# Patient Record
Sex: Female | Born: 1955
Health system: Southern US, Community
[De-identification: ages and names within clinical notes are randomized; demographics above are authoritative.]

## PROBLEM LIST (undated history)

## (undated) DIAGNOSIS — I150 Renovascular hypertension: Secondary | ICD-10-CM

## (undated) DIAGNOSIS — I499 Cardiac arrhythmia, unspecified: Secondary | ICD-10-CM

## (undated) DIAGNOSIS — C50919 Malignant neoplasm of unspecified site of unspecified female breast: Secondary | ICD-10-CM

## (undated) DIAGNOSIS — R001 Bradycardia, unspecified: Secondary | ICD-10-CM

## (undated) DIAGNOSIS — I773 Arterial fibromuscular dysplasia: Secondary | ICD-10-CM

## (undated) DIAGNOSIS — L509 Urticaria, unspecified: Secondary | ICD-10-CM

## (undated) DIAGNOSIS — E2839 Other primary ovarian failure: Secondary | ICD-10-CM

## (undated) DIAGNOSIS — Z9889 Other specified postprocedural states: Secondary | ICD-10-CM

## (undated) DIAGNOSIS — R112 Nausea with vomiting, unspecified: Secondary | ICD-10-CM

## (undated) DIAGNOSIS — H04123 Dry eye syndrome of bilateral lacrimal glands: Secondary | ICD-10-CM

## (undated) HISTORY — DX: Malignant neoplasm of unspecified site of unspecified female breast: C50.919

## (undated) HISTORY — PX: OTHER SURGICAL HISTORY: SHX169

## (undated) HISTORY — DX: Bradycardia, unspecified: R00.1

## (undated) HISTORY — DX: Arterial fibromuscular dysplasia: I77.3

## (undated) HISTORY — DX: Dry eye syndrome of bilateral lacrimal glands: H04.123

## (undated) HISTORY — DX: Cardiac arrhythmia, unspecified: I49.9

## (undated) HISTORY — DX: Renovascular hypertension: I15.0

## (undated) HISTORY — DX: Other primary ovarian failure: E28.39

## (undated) HISTORY — DX: Urticaria, unspecified: L50.9

---

## 1999-05-06 ENCOUNTER — Encounter: Payer: Self-pay | Admitting: *Deleted

## 1999-05-06 ENCOUNTER — Emergency Department (HOSPITAL_COMMUNITY): Admission: EM | Admit: 1999-05-06 | Discharge: 1999-05-06 | Payer: Self-pay | Admitting: Emergency Medicine

## 2004-02-01 ENCOUNTER — Encounter: Admission: RE | Admit: 2004-02-01 | Discharge: 2004-02-01 | Payer: Self-pay | Admitting: Sports Medicine

## 2004-02-01 IMAGING — NM NM BONE 3 PHASE
8 series · 8 of 8 positions shown · non-contrast
Comparison: none

CLINICAL DATA: Bilateral tib/fib pain. 
 NM THREE PHASE BONE SCAN: 
 The patient was injected with 25.2 mCi of [Y5] intravenously, and three phase bone scan of the lower legs was performed.  On the flow phase there is symmetrical flow to both lower legs.  On soft tissue phase no abnormality is seen.  On delayed images activity is normal.

[st statics, dual detec · 2.36mm/px · 1 of 1 slices shown (1 of 4)]
[im 1/1]
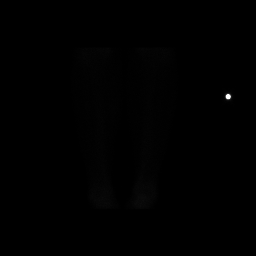

[bf bone flow · 2.36mm/px · 1 of 1 slices shown (1 of 4)]
[im 1/1]
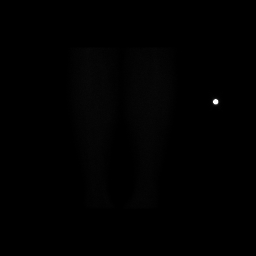

[bf bone flow · 2.36mm/px · 1 of 1 slices shown (2 of 4)]
[im 1/1]
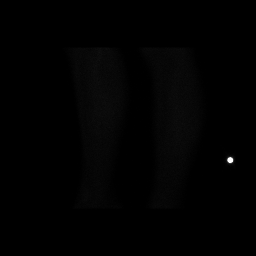

[bf bone flow · 2.33mm/px · 1 of 1 slices shown (3 of 4)]
[im 1/1]
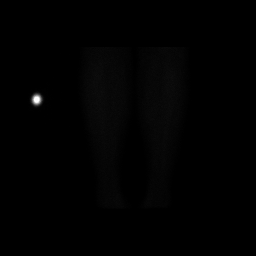

[st statics, dual detec · 2.36mm/px · 1 of 1 slices shown (2 of 4)]
[im 1/1]
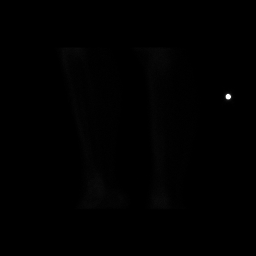

[bf bone flow · 2.33mm/px · 1 of 1 slices shown (4 of 4)]
[im 1/1]
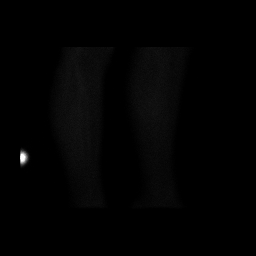

[st statics, dual detec · 2.33mm/px · 1 of 1 slices shown (3 of 4)]
[im 1/1]
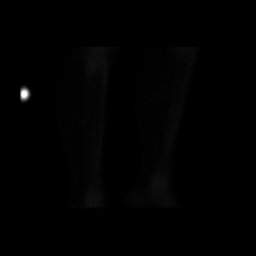

[st statics, dual detec · 2.33mm/px · 1 of 1 slices shown (4 of 4)]
[im 1/1]
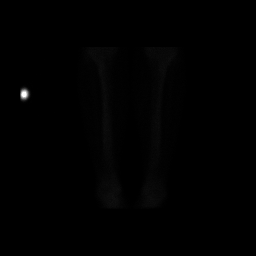

[8 of 8 positions shown; findings below may reference images not displayed]

IMPRESSION: Negative limited three phase bone scan of the lower legs.

## 2008-08-02 ENCOUNTER — Emergency Department (HOSPITAL_COMMUNITY): Admission: EM | Admit: 2008-08-02 | Discharge: 2008-08-02 | Payer: Self-pay | Admitting: Family Medicine

## 2009-06-14 ENCOUNTER — Encounter: Admission: RE | Admit: 2009-06-14 | Discharge: 2009-06-14 | Payer: Self-pay | Admitting: Family Medicine

## 2009-06-14 ENCOUNTER — Other Ambulatory Visit: Admission: RE | Admit: 2009-06-14 | Discharge: 2009-06-14 | Payer: Self-pay | Admitting: Family Medicine

## 2009-06-14 IMAGING — CR DG HIP (WITH OR WITHOUT PELVIS) 2-3V*L*
2 series · 2 of 2 positions shown · non-contrast
Comparison: None

CLINICAL DATA: Pain, no trauma

LEFT HIP - COMPLETE 2+ VIEW

[view not recorded (1 of 2)]
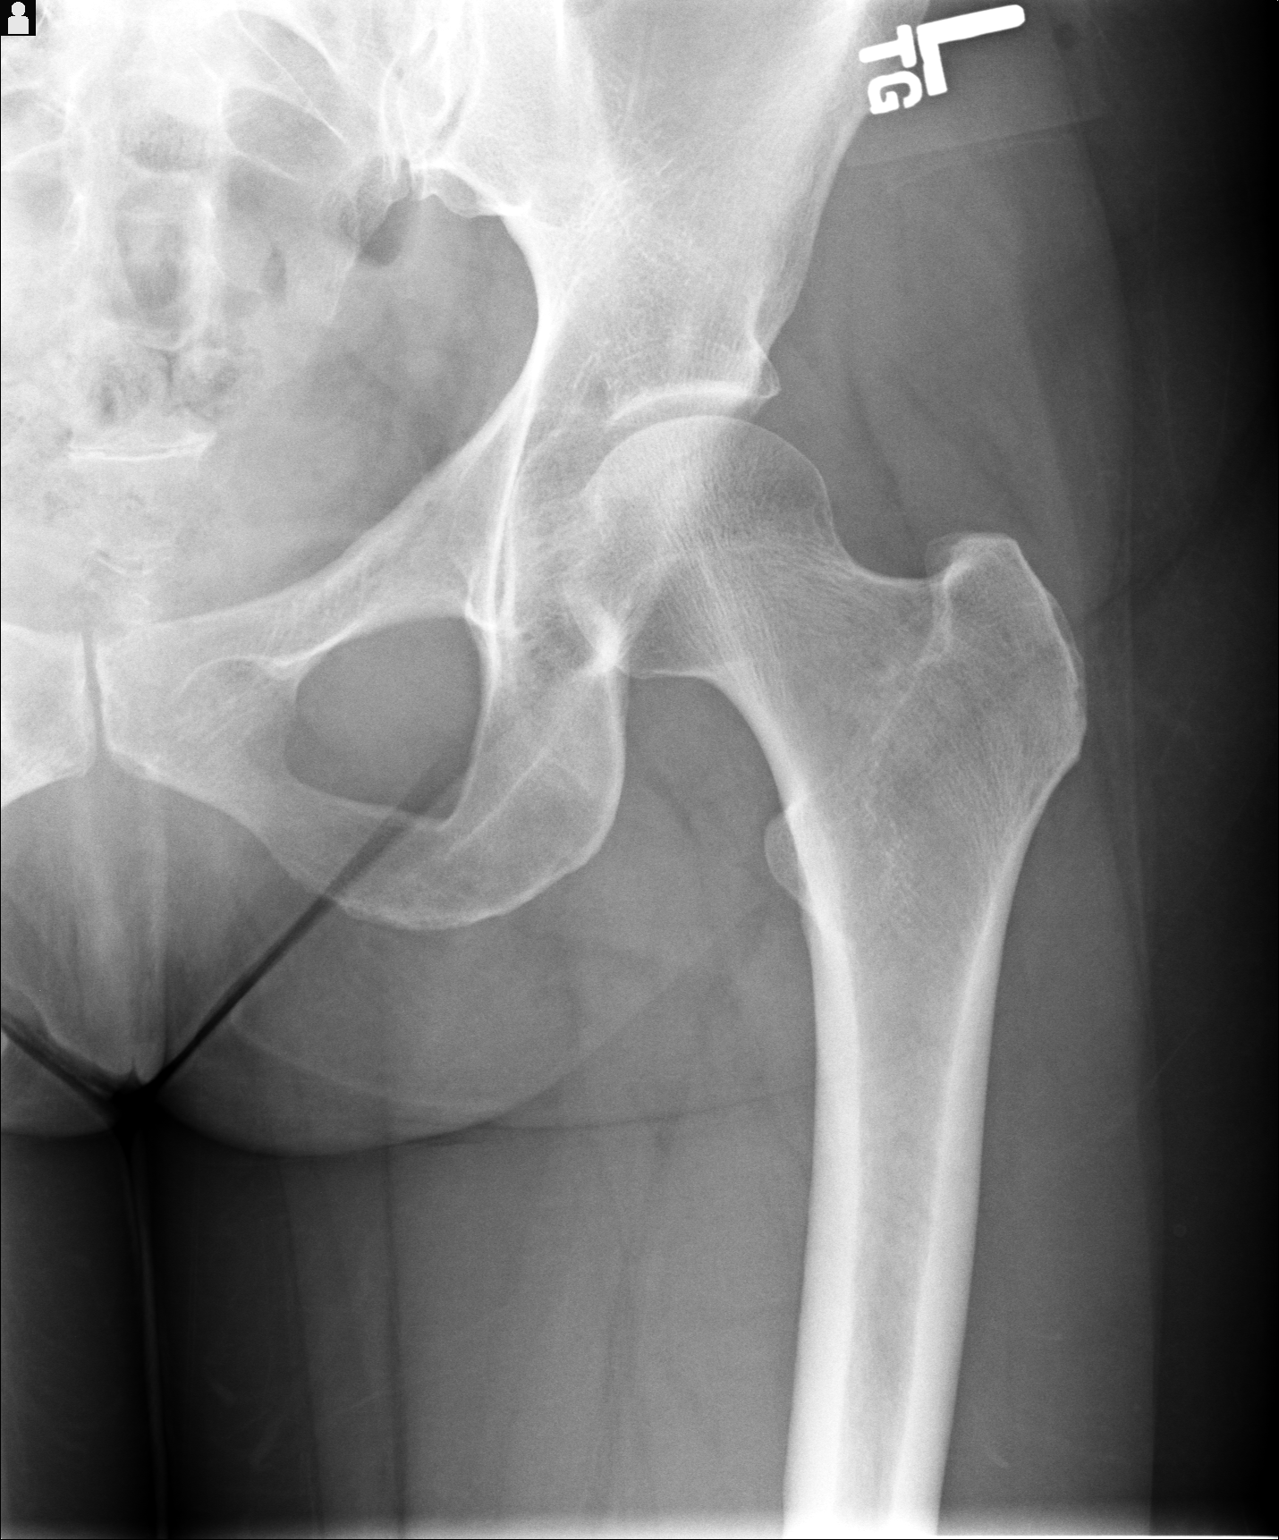

[view not recorded (2 of 2)]
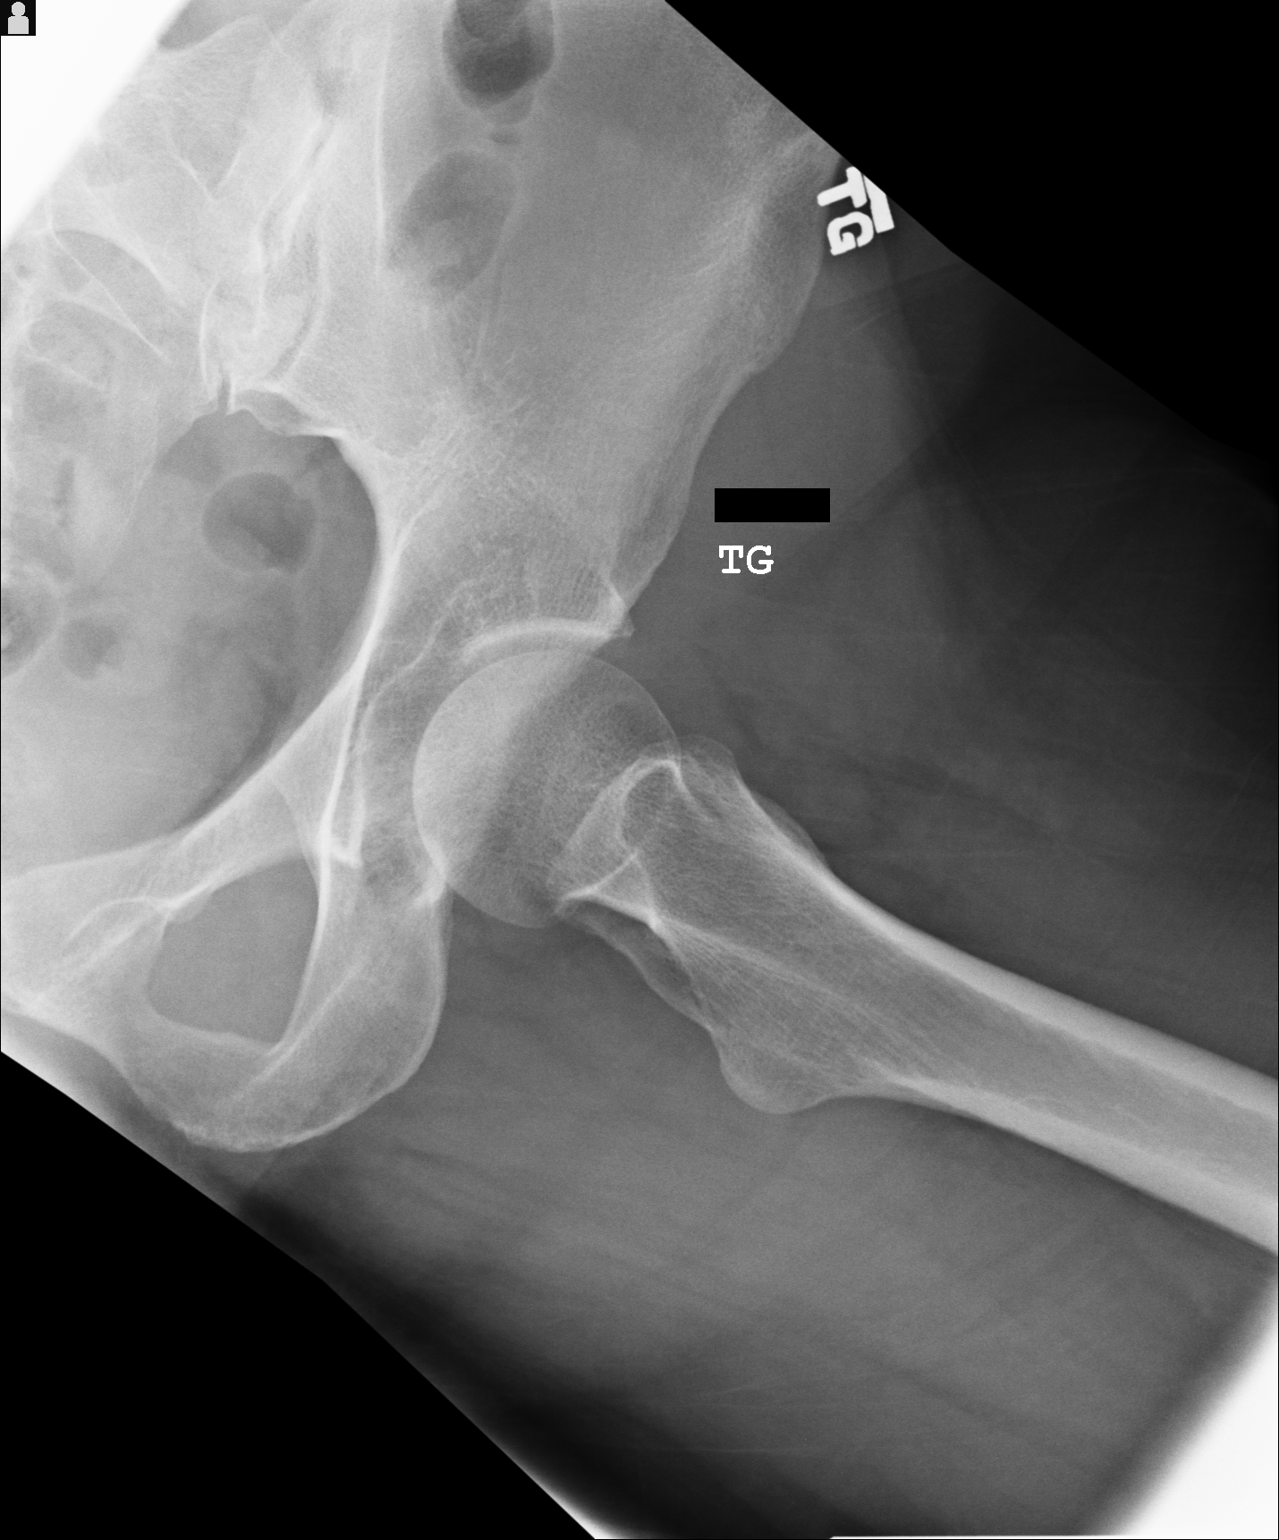

[2 of 2 positions shown; findings below may reference images not displayed]

FINDINGS: Two views of the left hip demonstrate no evidence of
femoral neck fracture.  Left hip is articulated.  Joint space
appears normal.
IMPRESSION: No acute or chronic findings of the left hip.

## 2014-04-03 ENCOUNTER — Other Ambulatory Visit (HOSPITAL_COMMUNITY)
Admission: RE | Admit: 2014-04-03 | Discharge: 2014-04-03 | Disposition: A | Discharge status: Home / Self Care | Payer: BC Managed Care – PPO | Source: Ambulatory Visit | Attending: Family Medicine | Admitting: Family Medicine

## 2014-04-03 ENCOUNTER — Other Ambulatory Visit: Payer: Self-pay | Admitting: Family Medicine

## 2014-04-03 DIAGNOSIS — Z0000 Encounter for general adult medical examination without abnormal findings: Secondary | ICD-10-CM | POA: Insufficient documentation

## 2014-04-04 LAB — CYTOLOGY - PAP

## 2016-05-01 DIAGNOSIS — E7800 Pure hypercholesterolemia, unspecified: Secondary | ICD-10-CM | POA: Diagnosis not present

## 2016-05-01 DIAGNOSIS — Z0000 Encounter for general adult medical examination without abnormal findings: Secondary | ICD-10-CM | POA: Diagnosis not present

## 2016-05-01 DIAGNOSIS — Z23 Encounter for immunization: Secondary | ICD-10-CM | POA: Diagnosis not present

## 2016-05-04 DIAGNOSIS — Z1231 Encounter for screening mammogram for malignant neoplasm of breast: Secondary | ICD-10-CM | POA: Diagnosis not present

## 2017-01-08 DIAGNOSIS — Z23 Encounter for immunization: Secondary | ICD-10-CM | POA: Diagnosis not present

## 2017-05-04 DIAGNOSIS — Z1231 Encounter for screening mammogram for malignant neoplasm of breast: Secondary | ICD-10-CM | POA: Diagnosis not present

## 2017-06-03 DIAGNOSIS — Z0000 Encounter for general adult medical examination without abnormal findings: Secondary | ICD-10-CM | POA: Diagnosis not present

## 2017-08-05 DIAGNOSIS — Z23 Encounter for immunization: Secondary | ICD-10-CM | POA: Diagnosis not present

## 2018-05-10 DIAGNOSIS — Z1231 Encounter for screening mammogram for malignant neoplasm of breast: Secondary | ICD-10-CM | POA: Diagnosis not present

## 2018-06-07 ENCOUNTER — Other Ambulatory Visit (HOSPITAL_COMMUNITY)
Admission: RE | Admit: 2018-06-07 | Discharge: 2018-06-07 | Disposition: A | Discharge status: Home / Self Care | Payer: BLUE CROSS/BLUE SHIELD | Source: Ambulatory Visit | Attending: Family Medicine | Admitting: Family Medicine

## 2018-06-07 ENCOUNTER — Other Ambulatory Visit: Payer: Self-pay | Admitting: Family Medicine

## 2018-06-07 DIAGNOSIS — Z124 Encounter for screening for malignant neoplasm of cervix: Secondary | ICD-10-CM | POA: Diagnosis not present

## 2018-06-07 DIAGNOSIS — E7800 Pure hypercholesterolemia, unspecified: Secondary | ICD-10-CM | POA: Diagnosis not present

## 2018-06-07 DIAGNOSIS — Z1211 Encounter for screening for malignant neoplasm of colon: Secondary | ICD-10-CM | POA: Diagnosis not present

## 2018-06-07 DIAGNOSIS — Z0000 Encounter for general adult medical examination without abnormal findings: Secondary | ICD-10-CM | POA: Insufficient documentation

## 2018-06-13 LAB — CYTOLOGY - PAP: Diagnosis: NEGATIVE

## 2018-06-15 ENCOUNTER — Other Ambulatory Visit: Payer: Self-pay

## 2018-06-15 DIAGNOSIS — Z7184 Encounter for health counseling related to travel: Secondary | ICD-10-CM

## 2018-06-16 ENCOUNTER — Telehealth: Payer: Self-pay

## 2018-06-16 LAB — MEASLES/MUMPS/RUBELLA IMMUNITY
MUMPS ABS, IGG: >300 AU/mL (ref >10.9)
RUBEOLA AB, IGG: >300 AU/mL (ref >16.4)
Rubella Antibodies, IGG: 11.3 index (ref >0.99)

## 2018-06-16 NOTE — Telephone Encounter (Signed)
Patient is to follow up with the travel clinic for cholera consult.  Patient is aware and appointment has already been scheduled with the travel clinic by the patient.

## 2018-07-12 ENCOUNTER — Encounter: Payer: Self-pay | Admitting: Medical

## 2018-11-08 DIAGNOSIS — J020 Streptococcal pharyngitis: Secondary | ICD-10-CM | POA: Diagnosis not present

## 2019-04-19 ENCOUNTER — Ambulatory Visit: Payer: Self-pay

## 2019-04-19 ENCOUNTER — Other Ambulatory Visit: Payer: Self-pay

## 2019-04-19 DIAGNOSIS — Z23 Encounter for immunization: Secondary | ICD-10-CM

## 2019-04-21 ENCOUNTER — Other Ambulatory Visit: Payer: Self-pay

## 2019-05-16 DIAGNOSIS — Z1231 Encounter for screening mammogram for malignant neoplasm of breast: Secondary | ICD-10-CM | POA: Diagnosis not present

## 2019-05-16 DIAGNOSIS — Z803 Family history of malignant neoplasm of breast: Secondary | ICD-10-CM | POA: Diagnosis not present

## 2019-11-02 DIAGNOSIS — I150 Renovascular hypertension: Secondary | ICD-10-CM | POA: Diagnosis not present

## 2019-11-02 DIAGNOSIS — E7800 Pure hypercholesterolemia, unspecified: Secondary | ICD-10-CM | POA: Diagnosis not present

## 2019-11-02 DIAGNOSIS — I773 Arterial fibromuscular dysplasia: Secondary | ICD-10-CM | POA: Diagnosis not present

## 2019-11-02 DIAGNOSIS — R001 Bradycardia, unspecified: Secondary | ICD-10-CM | POA: Diagnosis not present

## 2019-11-02 DIAGNOSIS — Z0000 Encounter for general adult medical examination without abnormal findings: Secondary | ICD-10-CM | POA: Diagnosis not present

## 2019-11-07 DIAGNOSIS — Z780 Asymptomatic menopausal state: Secondary | ICD-10-CM | POA: Diagnosis not present

## 2019-11-07 DIAGNOSIS — M8589 Other specified disorders of bone density and structure, multiple sites: Secondary | ICD-10-CM | POA: Diagnosis not present

## 2019-11-30 ENCOUNTER — Telehealth: Payer: Self-pay

## 2019-11-30 NOTE — Telephone Encounter (Signed)
Left message for patient to call back. Appointment on 05/13 with Dr. Radford Pax needs to be switched to virtual or rescheduled.

## 2019-12-07 ENCOUNTER — Ambulatory Visit: Payer: BLUE CROSS/BLUE SHIELD | Admitting: Cardiology

## 2019-12-07 DIAGNOSIS — E7800 Pure hypercholesterolemia, unspecified: Secondary | ICD-10-CM | POA: Insufficient documentation

## 2019-12-07 HISTORY — DX: Pure hypercholesterolemia, unspecified: E78.00

## 2019-12-12 ENCOUNTER — Other Ambulatory Visit: Payer: Self-pay

## 2019-12-12 ENCOUNTER — Encounter: Payer: Self-pay | Admitting: Cardiology

## 2019-12-12 ENCOUNTER — Encounter: Payer: BC Managed Care – PPO | Admitting: Cardiology

## 2019-12-12 NOTE — Progress Notes (Signed)
This encounter was created in error - please disregard.

## 2019-12-22 ENCOUNTER — Ambulatory Visit: Payer: BC Managed Care – PPO | Admitting: Cardiovascular Disease

## 2020-01-03 ENCOUNTER — Encounter: Payer: Self-pay | Admitting: Cardiovascular Disease

## 2020-01-03 ENCOUNTER — Other Ambulatory Visit: Payer: Self-pay

## 2020-01-03 ENCOUNTER — Ambulatory Visit (INDEPENDENT_AMBULATORY_CARE_PROVIDER_SITE_OTHER): Payer: BC Managed Care – PPO | Admitting: Cardiovascular Disease

## 2020-01-03 DIAGNOSIS — I10 Essential (primary) hypertension: Secondary | ICD-10-CM | POA: Insufficient documentation

## 2020-01-03 DIAGNOSIS — E7800 Pure hypercholesterolemia, unspecified: Secondary | ICD-10-CM | POA: Diagnosis not present

## 2020-01-03 DIAGNOSIS — I773 Arterial fibromuscular dysplasia: Secondary | ICD-10-CM | POA: Diagnosis not present

## 2020-01-03 DIAGNOSIS — I701 Atherosclerosis of renal artery: Secondary | ICD-10-CM | POA: Diagnosis not present

## 2020-01-03 NOTE — Assessment & Plan Note (Signed)
Whitney Ford was referred to me by Dr. 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  She apparently was being worked up as a live renal donor for her sister who has renal failure secondary to lithium use and in the process of the work-up she was found to have right renal artery FMD and was thus disqualified.  Her blood pressures under excellent control on low-dose Benicar.  This point, there is no indication for intervention.  I am going to get renal Dopplers to follow this as well as carotid Dopplers to rule out other areas of FMD.

## 2020-01-03 NOTE — Progress Notes (Signed)
01/03/2020 Whitney Ford   23-Jun-1956  [id number]  Primary Physician Patient, No Pcp Per Primary Cardiologist: Lorretta Harp MD Whitney Ford, [place]  HPI:  Whitney Ford is a 64 y.o. fit appearing married Caucasian female mother of 3 children, grandmother of 2 grandchildren referred by Dr. Moreen Ford for peripheral vascular valuation because of right renal artery FMD.  She works as a Educational psychologist at [employer].  Her risk factors include hyperlipidemia and hypertension.  There is no family history of heart disease.  She is never had a heart attack or stroke.  She denies chest pain or shortness of breath.  She is very active and has been a triathlete in the past currently running 4-5 times a week without symptoms.  Her sister unfortunately has chronic renal insufficiency secondary to lithium use for bipolar disorder.  She was being worked up as a living donor and in the course of the work-up she was found to have right renal artery FMD.  She does have well-controlled hypertension on minimal doses of Benicar.   Current Meds  Medication Sig  . Calcium Carbonate 500 MG CHEW at bedtime. 2 chews at bedtime nightly  . cetirizine (ZYRTEC) 10 MG tablet Take 10 mg by mouth daily.  . Multiple Vitamin (MULTIVITAMIN) tablet Take 1 tablet by mouth daily.  Marland Kitchen olmesartan (BENICAR) 20 MG tablet Take 20 mg by mouth daily.  . Omega-3 Fatty Acids (FISH OIL) 1200 MG CAPS Take by mouth.     No Known Allergies  Social History   Socioeconomic History  . Marital status: Married    Spouse name: Not on file  . Number of children: Not on file  . Years of education: Not on file  . Highest education level: Not on file  Occupational History  . Not on file  Tobacco Use  . Smoking status: Never Smoker  . Smokeless tobacco: Never Used  Substance and Sexual Activity  . Alcohol use: Not on file  . Drug use: Not on file  . Sexual activity: Not on file    Comment: charlie  Other Topics Concern  . Not on  file  Social History Narrative  . Not on file   Social Determinants of Health   Financial Resource Strain:   . Difficulty of Paying Living Expenses:   Food Insecurity:   . Worried About Charity fundraiser in the Last Year:   . Arboriculturist in the Last Year:   Transportation Needs:   . Film/video editor (Medical):   Marland Kitchen Lack of Transportation (Non-Medical):   Physical Activity:   . Days of Exercise per Week:   . Minutes of Exercise per Session:   Stress:   . Feeling of Stress :   Social Connections:   . Frequency of Communication with Friends and Family:   . Frequency of Social Gatherings with Friends and Family:   . Attends Religious Services:   . Active Member of Clubs or Organizations:   . Attends Archivist Meetings:   Marland Kitchen Marital Status:   Intimate Partner Violence:   . Fear of Current or Ex-Partner:   . Emotionally Abused:   Marland Kitchen Physically Abused:   . Sexually Abused:      Review of Systems: General: negative for chills, fever, night sweats or weight changes.  Cardiovascular: negative for chest pain, dyspnea on exertion, edema, orthopnea, palpitations, paroxysmal nocturnal dyspnea or shortness of breath Dermatological: negative for rash Respiratory: negative  for cough or wheezing Urologic: negative for hematuria Abdominal: negative for nausea, vomiting, diarrhea, bright red blood per rectum, melena, or hematemesis Neurologic: negative for visual changes, syncope, or dizziness All other systems reviewed and are otherwise negative except as noted above.    Blood pressure 114/78, pulse (!) 54, height 5\' 4"  (1.626 m), weight 158 lb 6.4 oz (71.8 kg), SpO2 97 %.  General appearance: alert and no distress Neck: no adenopathy, no carotid bruit, no JVD, supple, symmetrical, trachea midline and thyroid not enlarged, symmetric, no tenderness/mass/nodules Lungs: clear to auscultation bilaterally Heart: regular rate and rhythm, S1, S2 normal, no murmur, click,  rub or gallop Extremities: extremities normal, atraumatic, no cyanosis or edema Pulses: 2+ and symmetric Skin: Skin color, texture, turgor normal. No rashes or lesions Neurologic: Alert and oriented X 3, normal strength and tone. Normal symmetric reflexes. Normal coordination and gait  EKG sinus bradycardia 54 with evidence of LVH.  I personally reviewed this EKG.  ASSESSMENT AND PLAN:   Elevated LDL cholesterol level History of hyperlipidemia with recent lipid profile performed 06/07/2018 revealing LDL of 152 and HDL of 70.  I am going to get a coronary calcium score to risk stratify and help determine aggressiveness of risk factor modification.  Essential hypertension History of essential hypertension with blood pressure measured today at 114/78 on low-dose Benicar.  Renal artery stenosis due to fibromuscular dysplasia [facility]) Whitney Ford was referred to me by Dr. 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  She apparently was being worked up as a live renal donor for her sister who has renal failure secondary to lithium use and in the process of the work-up she was found to have right renal artery FMD and was thus disqualified.  Her blood pressures under excellent control on low-dose Benicar.  This point, there is no indication for intervention.  I am going to get renal Dopplers to follow this as well as carotid Dopplers to rule out other areas of FMD.      Lorretta Harp MD [facility], [facility] 01/03/2020 1:59 PM

## 2020-01-03 NOTE — Assessment & Plan Note (Signed)
History of essential hypertension with blood pressure measured today at 114/78 on low-dose Benicar.

## 2020-01-03 NOTE — Assessment & Plan Note (Signed)
History of hyperlipidemia with recent lipid profile performed 06/07/2018 revealing LDL of 152 and HDL of 70.  I am going to get a coronary calcium score to risk stratify and help determine aggressiveness of risk factor modification.

## 2020-01-03 NOTE — Patient Instructions (Signed)
Medication Instructions:  The current medical regimen is effective;  continue present plan and medications.  *If you need a refill on your cardiac medications before your next appointment, please call your pharmacy*   Testing/Procedures: Your physician has requested that you have a renal artery duplex. During this test, an ultrasound is used to evaluate blood flow to the kidneys. Allow one hour for this exam. Do not eat after midnight the day before and avoid carbonated beverages. Take your medications as you usually do.  Your physician has requested that you have a carotid duplex. This test is an ultrasound of the carotid arteries in your neck. It looks at blood flow through these arteries that supply the brain with blood. Allow one hour for this exam. There are no restrictions or special instructions.  CALCIUM SCORE    Follow-Up: At [facility], you and your health needs are our priority.  As part of our continuing mission to provide you with exceptional heart care, we have created designated Provider Care Teams.  These Care Teams include your primary Cardiologist (physician) and Advanced Practice Providers (APPs -  Physician Assistants and Nurse Practitioners) who all work together to provide you with the care you need, when you need it.  We recommend signing up for the patient portal called "MyChart".  Sign up information is provided on this After Visit Summary.  MyChart is used to connect with patients for Virtual Visits (Telemedicine).  Patients are able to view lab/test results, encounter notes, upcoming appointments, etc.  Non-urgent messages can be sent to your provider as well.   To learn more about what you can do with MyChart, go to [email or web address].    Your next appointment:   As needed  The format for your next appointment:   In Person  Provider:   Quay Burow, MD

## 2020-01-11 ENCOUNTER — Ambulatory Visit (INDEPENDENT_AMBULATORY_CARE_PROVIDER_SITE_OTHER)
Admission: RE | Admit: 2020-01-11 | Discharge: 2020-01-11 | Disposition: A | Discharge status: Home / Self Care | Payer: Self-pay | Source: Ambulatory Visit | Attending: Cardiovascular Disease | Admitting: Cardiovascular Disease

## 2020-01-11 ENCOUNTER — Other Ambulatory Visit: Payer: Self-pay

## 2020-01-11 DIAGNOSIS — E7800 Pure hypercholesterolemia, unspecified: Secondary | ICD-10-CM

## 2020-01-11 IMAGING — CT CT CARDIAC CORONARY ARTERY CALCIUM SCORE
1 series · 14 of 20 positions shown, 18 images · non-contrast
Comparison: None.
COMPARISON: None.

Addendum:
EXAM:
OVER-READ INTERPRETATION  CT CHEST

The following report is an over-read performed by radiologist Dr.
RAMPERSAD [REDACTED] on [DATE]. This
over-read does not include interpretation of cardiac or coronary
anatomy or pathology. The coronary calcium score/coronary CTA
interpretation by the cardiologist is attached.
CLINICAL DATA: Risk stratification, CAD eval, asymptomatic, family
hx
Coronary Calcium Score
TECHNIQUE: The patient was scanned on a Siemens Force scanner. Axial
non-contrast 3 mm slices were carried out through the heart. The
data set was analyzed on a dedicated work station and scored using
the Agatson method.

[Series 2: casc 3.0 bv41 2 bestdiast 70 % · axial · 0.33mm/px · z∈[-208,-88]mm · 14 of 46 slices shown, 18 images]
[im 3/46  vessel]
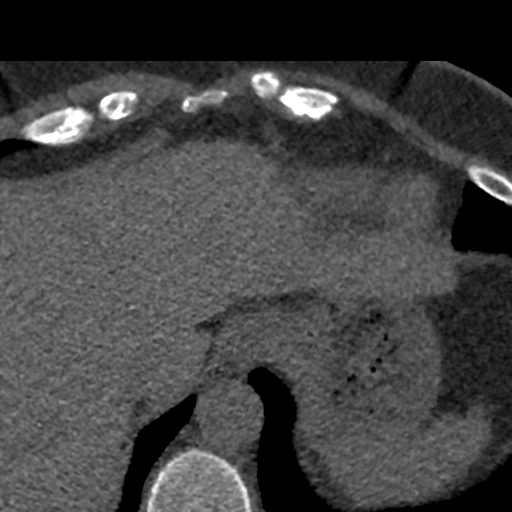
[im 3/46  lung]
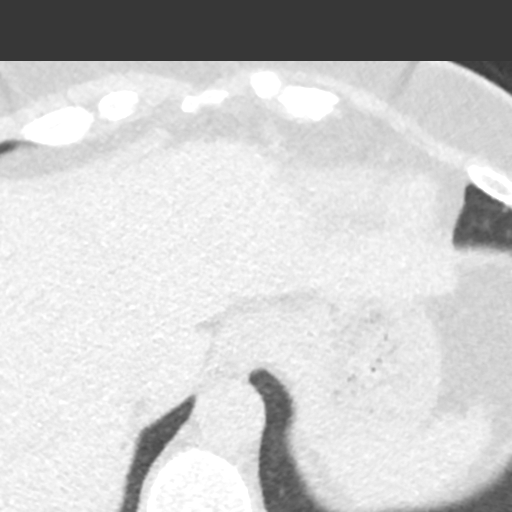
[im 5/46  vessel]
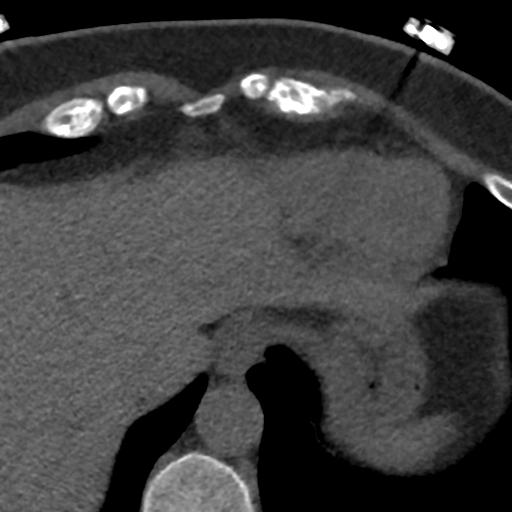
[im 10/46  vessel]
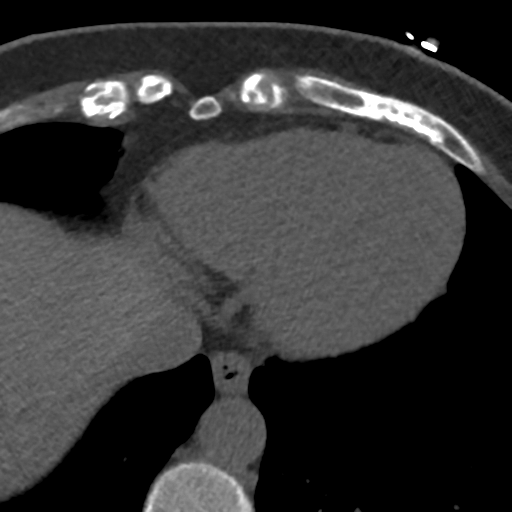
[im 12/46  vessel]
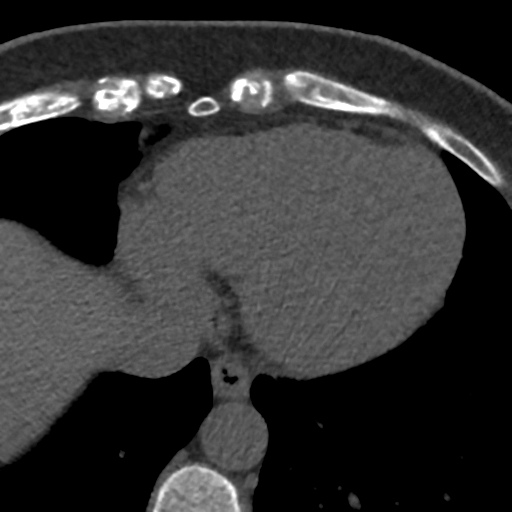
[im 15/46  vessel]
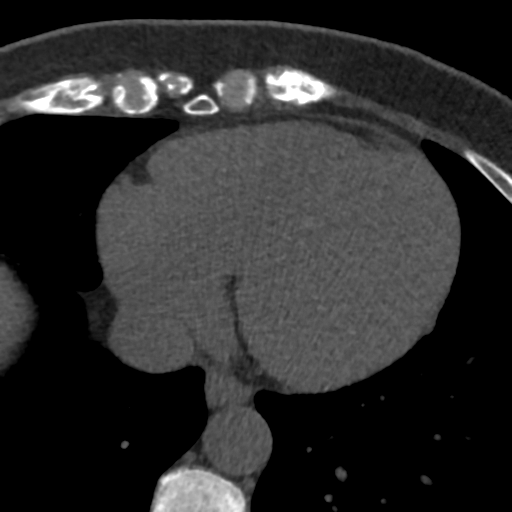
[im 15/46  lung]
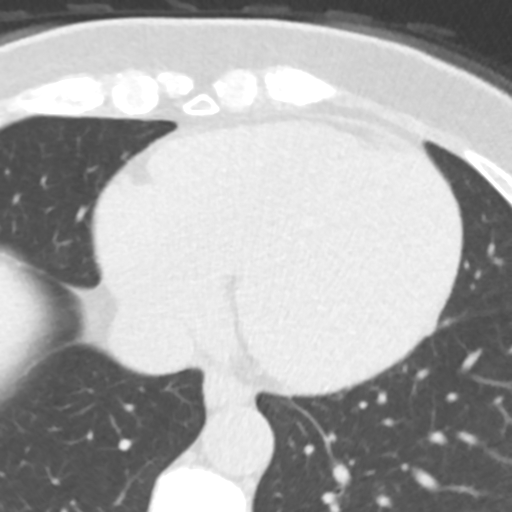
[im 19/46  vessel]
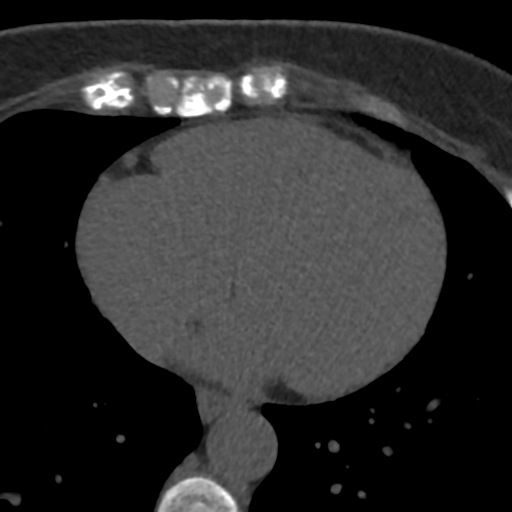
[im 22/46  vessel]
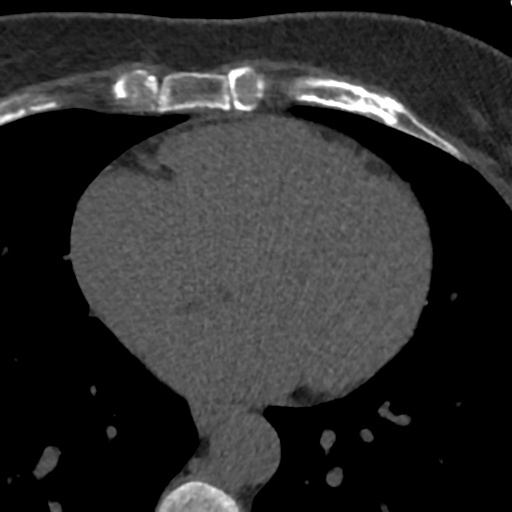
[im 24/46  vessel]
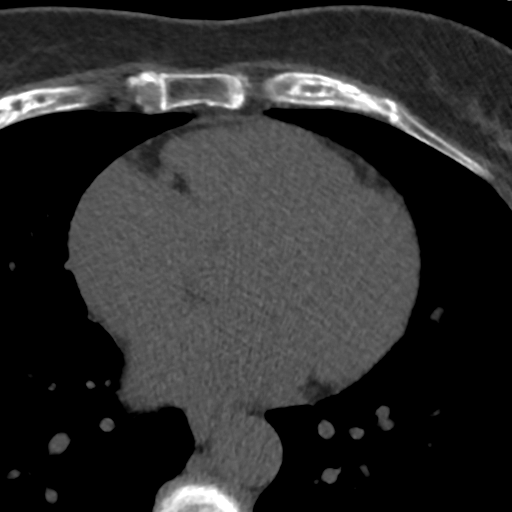
[im 27/46  vessel]
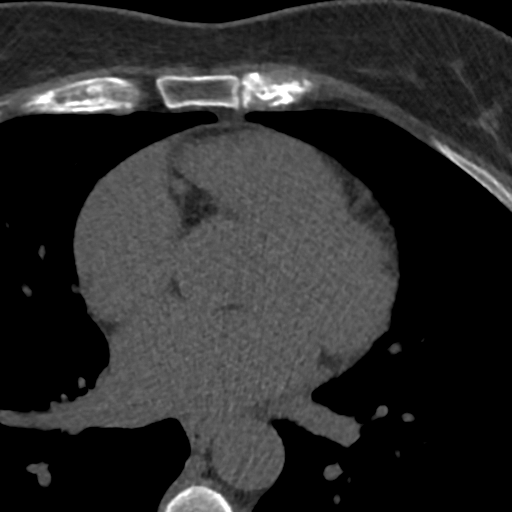
[im 27/46  lung]
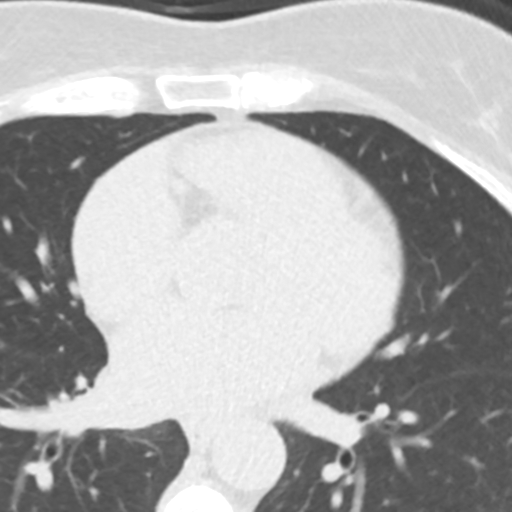
[im 31/46  vessel]
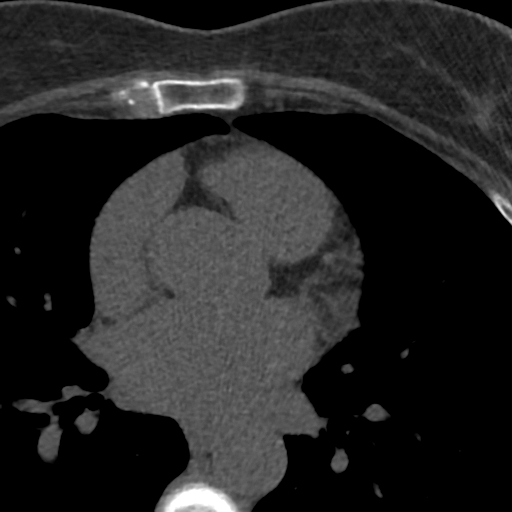
[im 34/46  vessel]
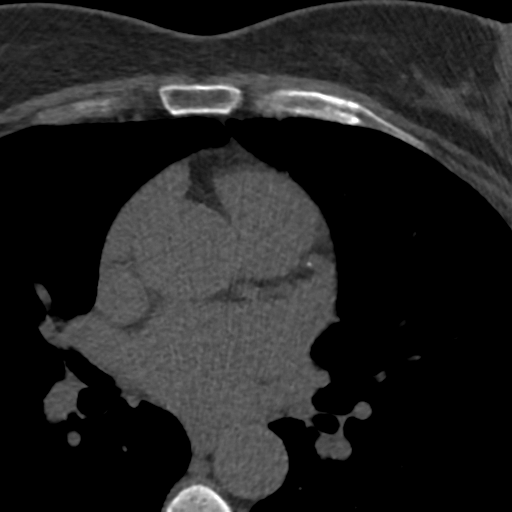
[im 36/46  vessel]
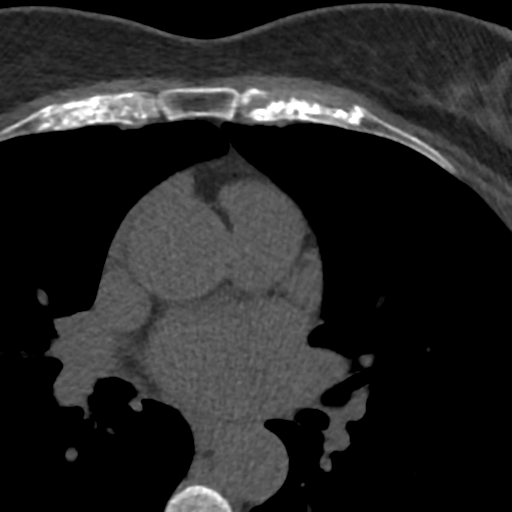
[im 41/46  vessel]
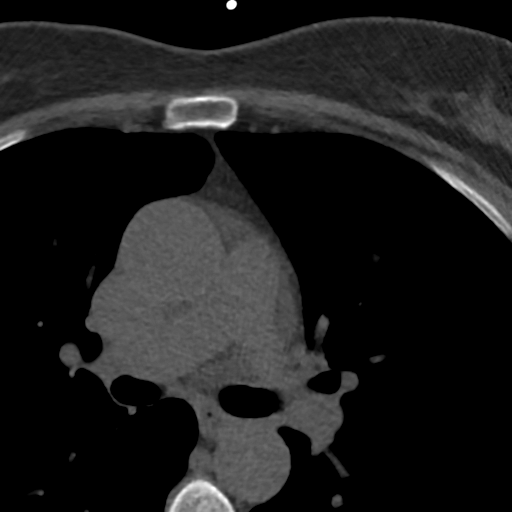
[im 41/46  lung]
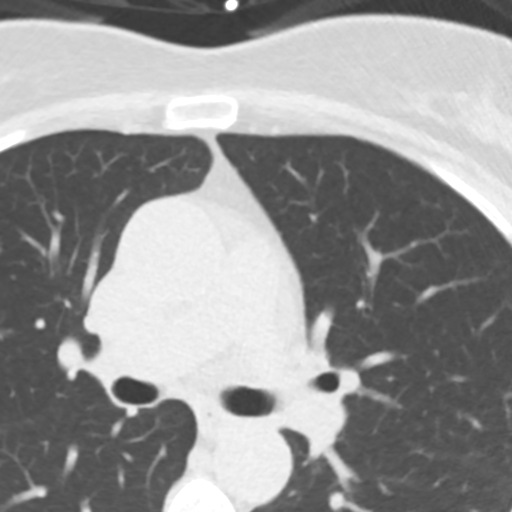
[im 43/46  vessel]
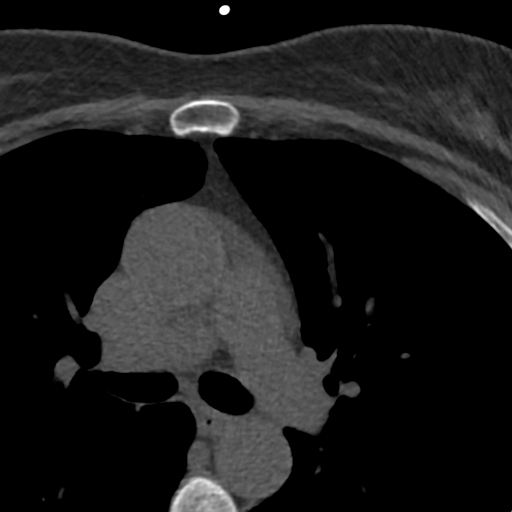

[14 of 20 positions shown; findings below may reference images not displayed]

FINDINGS: Within the visualized portions of the thorax there are no suspicious
appearing pulmonary nodules or masses, there is no acute
consolidative airspace disease, no pleural effusions, no
pneumothorax and no lymphadenopathy. Visualized portions of the
upper abdomen are unremarkable. There are no aggressive appearing
lytic or blastic lesions noted in the visualized portions of the
skeleton.
IMPRESSION: No significant incidental noncardiac findings are noted.
FINDINGS: Non-cardiac: See separate report from [REDACTED].

Ascending Aorta: 34 mm at the mid ascending aorta measured in an
axial plane.

Pericardium: Normal.  Trivial pericardial effusion.

Coronary arteries:

Coronary calcium score of 9. This was 61st percentile for age and
sex matched control.
IMPRESSION: Coronary calcium score of 9. This was 61st percentile for age and
sex matched control. Calcium seen in the LAD and mid RCA.

*** End of Addendum ***
EXAM:
OVER-READ INTERPRETATION  CT CHEST

The following report is an over-read performed by radiologist Dr.
RAMPERSAD [REDACTED] on [DATE]. This
over-read does not include interpretation of cardiac or coronary
anatomy or pathology. The coronary calcium score/coronary CTA
interpretation by the cardiologist is attached.
FINDINGS: Within the visualized portions of the thorax there are no suspicious
appearing pulmonary nodules or masses, there is no acute
consolidative airspace disease, no pleural effusions, no
pneumothorax and no lymphadenopathy. Visualized portions of the
upper abdomen are unremarkable. There are no aggressive appearing
lytic or blastic lesions noted in the visualized portions of the
skeleton.
IMPRESSION: No significant incidental noncardiac findings are noted.

## 2020-01-16 ENCOUNTER — Other Ambulatory Visit: Payer: Self-pay

## 2020-01-16 DIAGNOSIS — E7800 Pure hypercholesterolemia, unspecified: Secondary | ICD-10-CM

## 2020-01-16 DIAGNOSIS — Z79899 Other long term (current) drug therapy: Secondary | ICD-10-CM

## 2020-01-16 MED ORDER — ATORVASTATIN CALCIUM 20 MG PO TABS: 20.0000 mg | ORAL_TABLET | Freq: Every day | ORAL | 6 refills | Status: DC

## 2020-02-05 ENCOUNTER — Other Ambulatory Visit: Payer: Self-pay

## 2020-02-05 ENCOUNTER — Ambulatory Visit (HOSPITAL_BASED_OUTPATIENT_CLINIC_OR_DEPARTMENT_OTHER)
Admission: RE | Admit: 2020-02-05 | Discharge: 2020-02-05 | Disposition: A | Discharge status: Home / Self Care | Payer: BC Managed Care – PPO | Source: Ambulatory Visit | Attending: Cardiovascular Disease | Admitting: Cardiovascular Disease

## 2020-02-05 ENCOUNTER — Ambulatory Visit (HOSPITAL_COMMUNITY)
Admission: RE | Admit: 2020-02-05 | Discharge: 2020-02-05 | Disposition: A | Discharge status: Home / Self Care | Payer: BC Managed Care – PPO | Source: Ambulatory Visit | Attending: Cardiovascular Disease | Admitting: Cardiovascular Disease

## 2020-02-05 DIAGNOSIS — I701 Atherosclerosis of renal artery: Secondary | ICD-10-CM

## 2020-02-05 DIAGNOSIS — I773 Arterial fibromuscular dysplasia: Secondary | ICD-10-CM

## 2020-05-03 DIAGNOSIS — R001 Bradycardia, unspecified: Secondary | ICD-10-CM | POA: Diagnosis not present

## 2020-05-03 DIAGNOSIS — M85852 Other specified disorders of bone density and structure, left thigh: Secondary | ICD-10-CM | POA: Diagnosis not present

## 2020-05-03 DIAGNOSIS — I773 Arterial fibromuscular dysplasia: Secondary | ICD-10-CM | POA: Diagnosis not present

## 2020-05-03 DIAGNOSIS — E7800 Pure hypercholesterolemia, unspecified: Secondary | ICD-10-CM | POA: Diagnosis not present

## 2020-05-03 DIAGNOSIS — I150 Renovascular hypertension: Secondary | ICD-10-CM | POA: Diagnosis not present

## 2020-05-16 DIAGNOSIS — Z1231 Encounter for screening mammogram for malignant neoplasm of breast: Secondary | ICD-10-CM | POA: Diagnosis not present

## 2020-11-11 DIAGNOSIS — E7800 Pure hypercholesterolemia, unspecified: Secondary | ICD-10-CM | POA: Diagnosis not present

## 2020-11-11 DIAGNOSIS — Z0000 Encounter for general adult medical examination without abnormal findings: Secondary | ICD-10-CM | POA: Diagnosis not present

## 2020-11-11 DIAGNOSIS — M85852 Other specified disorders of bone density and structure, left thigh: Secondary | ICD-10-CM | POA: Diagnosis not present

## 2020-11-11 DIAGNOSIS — Z23 Encounter for immunization: Secondary | ICD-10-CM | POA: Diagnosis not present

## 2020-11-11 DIAGNOSIS — I773 Arterial fibromuscular dysplasia: Secondary | ICD-10-CM | POA: Diagnosis not present

## 2020-11-11 DIAGNOSIS — R001 Bradycardia, unspecified: Secondary | ICD-10-CM | POA: Diagnosis not present

## 2020-11-11 DIAGNOSIS — R202 Paresthesia of skin: Secondary | ICD-10-CM | POA: Diagnosis not present

## 2020-11-11 DIAGNOSIS — I150 Renovascular hypertension: Secondary | ICD-10-CM | POA: Diagnosis not present

## 2020-12-03 DIAGNOSIS — G5603 Carpal tunnel syndrome, bilateral upper limbs: Secondary | ICD-10-CM | POA: Diagnosis not present

## 2020-12-11 DIAGNOSIS — R109 Unspecified abdominal pain: Secondary | ICD-10-CM | POA: Diagnosis not present

## 2020-12-11 DIAGNOSIS — Z03818 Encounter for observation for suspected exposure to other biological agents ruled out: Secondary | ICD-10-CM | POA: Diagnosis not present

## 2020-12-24 ENCOUNTER — Other Ambulatory Visit: Payer: Self-pay

## 2020-12-24 ENCOUNTER — Encounter: Payer: Self-pay | Admitting: Cardiovascular Disease

## 2020-12-24 ENCOUNTER — Ambulatory Visit: Payer: BC Managed Care – PPO | Admitting: Cardiovascular Disease

## 2020-12-24 VITALS — BP 110/78 | HR 61 | Ht 64.0 in | Wt 158.6 lb

## 2020-12-24 DIAGNOSIS — R931 Abnormal findings on diagnostic imaging of heart and coronary circulation: Secondary | ICD-10-CM

## 2020-12-24 DIAGNOSIS — I773 Arterial fibromuscular dysplasia: Secondary | ICD-10-CM

## 2020-12-24 DIAGNOSIS — E7800 Pure hypercholesterolemia, unspecified: Secondary | ICD-10-CM

## 2020-12-24 DIAGNOSIS — I10 Essential (primary) hypertension: Secondary | ICD-10-CM

## 2020-12-24 DIAGNOSIS — I701 Atherosclerosis of renal artery: Secondary | ICD-10-CM

## 2020-12-24 NOTE — Progress Notes (Signed)
12/24/2020 Whitney Ford   10-20-1955  [id number]  Primary Physician Antony Contras, MD Primary Cardiologist: Lorretta Harp MD FACP, [place], [place], [place]  HPI:  Whitney Ford is a 65 y.o.   fit appearing married Caucasian female mother of 3 children, grandmother of 2 grandchildren referred by Dr. Moreen Fowler for peripheral vascular valuation because of right renal artery FMD.    I last saw her in the office 01/03/2020.  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  Her risk factors include hyperlipidemia and hypertension.  There is no family history of heart disease.  She is never had a heart attack or stroke.  She denies chest pain or shortness of breath.  She is very active and has been a triathlete in the past currently running 4-5 times a week without symptoms.  Her sister unfortunately has chronic renal insufficiency secondary to lithium use for bipolar disorder.  She was being worked up as a living donor and in the course of the work-up she was found to have right renal artery FMD.  She does have well-controlled hypertension on minimal doses of Benicar.  Since I saw her a year ago she continues to do well.  She did retire.  She is very active.  I did do a coronary calcium score on her 01/12/2020 which was 9.  She is on low-dose atorvastatin with an LDL of 96.  She is completely asymptomatic.  She did recently have COVID approximate 10 days ago but tested positive recently and is totally asymptomatic.   Current Meds  Medication Sig  . atorvastatin (LIPITOR) 20 MG tablet Take 1 tablet (20 mg total) by mouth daily.  . Calcium Carbonate 500 MG CHEW at bedtime. 2 chews at bedtime nightly  . cetirizine (ZYRTEC) 10 MG tablet Take 10 mg by mouth daily.  . cholecalciferol (VITAMIN D) 25 MCG (1000 UNIT) tablet Take by mouth daily.  . Multiple Vitamin (MULTIVITAMIN) tablet Take 1 tablet by mouth daily.  Marland Kitchen olmesartan (BENICAR) 20 MG tablet Take 20 mg by mouth daily.  .  Omega-3 Fatty Acids (FISH OIL) 1200 MG CAPS Take by mouth.     No Known Allergies  Social History   Socioeconomic History  . Marital status: Married    Spouse name: Not on file  . Number of children: Not on file  . Years of education: Not on file  . Highest education level: Not on file  Occupational History  . Not on file  Tobacco Use  . Smoking status: Never Smoker  . Smokeless tobacco: Never Used  Substance and Sexual Activity  . Alcohol use: Not on file  . Drug use: Not on file  . Sexual activity: Not on file    Comment: charlie  Other Topics Concern  . Not on file  Social History Narrative  . Not on file   Social Determinants of Health   Financial Resource Strain: Not on file  Food Insecurity: Not on file  Transportation Needs: Not on file  Physical Activity: Not on file  Stress: Not on file  Social Connections: Not on file  Intimate Partner Violence: Not on file     Review of Systems: General: negative for chills, fever, night sweats or weight changes.  Cardiovascular: negative for chest pain, dyspnea on exertion, edema, orthopnea, palpitations, paroxysmal nocturnal dyspnea or shortness of breath Dermatological: negative for rash Respiratory: negative for cough or wheezing Urologic: negative for hematuria Abdominal: negative for  nausea, vomiting, diarrhea, bright red blood per rectum, melena, or hematemesis Neurologic: negative for visual changes, syncope, or dizziness All other systems reviewed and are otherwise negative except as noted above.    Blood pressure 110/78, pulse 61, height 5\' 4"  (1.626 m), weight 158 lb 9.6 oz (71.9 kg), SpO2 99 %.  General appearance: alert and no distress Neck: no adenopathy, no carotid bruit, no JVD, supple, symmetrical, trachea midline and thyroid not enlarged, symmetric, no tenderness/mass/nodules Lungs: clear to auscultation bilaterally Heart: regular rate and rhythm, S1, S2 normal, no murmur, click, rub or  gallop Extremities: extremities normal, atraumatic, no cyanosis or edema Pulses: 2+ and symmetric Skin: Skin color, texture, turgor normal. No rashes or lesions Neurologic: Alert and oriented X 3, normal strength and tone. Normal symmetric reflexes. Normal coordination and gait  EKG sinus rhythm at 61 without ST or T wave changes.  I personally reviewed this EKG.  ASSESSMENT AND PLAN:   Elevated LDL cholesterol level History of hyperlipidemia on atorvastatin with lipid profile performed most recently 11/11/2020 revealing total cholesterol 176, LDL of 96 and HDL of 64.  Essential hypertension History of essential hypertension blood pressure measured today at 110/78.  She is on olmesartan 20 mg a day.  Renal artery stenosis due to fibromuscular dysplasia (HCC) History of presumed renal artery FMD with Dopplers performed 02/05/2020 revealing symmetric renal dimensions, slightly elevated right renal artery ratio of 2.55 with mildly elevated velocity of 247.  I doubt this is hemodynamically significant.  We will recheck renal Doppler studies.  Elevated coronary artery calcium score Coronary calcium score of 9 measured 01/12/2020 now on low-dose atorvastatin with an LDL of 96.      Lorretta Harp MD FACP,FACC,FAHA, [facility] 12/24/2020 8:31 AM

## 2020-12-24 NOTE — Patient Instructions (Signed)
Medication Instructions:  Your physician recommends that you continue on your current medications as directed. Please refer to the Current Medication list given to you today.  *If you need a refill on your cardiac medications before your next appointment, please call your pharmacy*   Testing/Procedures: Your physician has requested that you have a renal artery duplex. During this test, an ultrasound is used to evaluate blood flow to the kidneys. Allow one hour for this exam. Do not eat after midnight the day before and avoid carbonated beverages. Take your medications as you usually do. This procedure is done at [facility]. 2nd Floor. To be done in July 2022.    Follow-Up: At [facility], you and your health needs are our priority.  As part of our continuing mission to provide you with exceptional heart care, we have created designated Provider Care Teams.  These Care Teams include your primary Cardiologist (physician) and Advanced Practice Providers (APPs -  Physician Assistants and Nurse Practitioners) who all work together to provide you with the care you need, when you need it.  We recommend signing up for the patient portal called "MyChart".  Sign up information is provided on this After Visit Summary.  MyChart is used to connect with patients for Virtual Visits (Telemedicine).  Patients are able to view lab/test results, encounter notes, upcoming appointments, etc.  Non-urgent messages can be sent to your provider as well.   To learn more about what you can do with MyChart, go to [email or web address].    Your next appointment:   No future appointments at this time. We will see you on an as needed basis.  Provider:   Quay Burow, MD

## 2020-12-24 NOTE — Assessment & Plan Note (Signed)
History of essential hypertension blood pressure measured today at 110/78.  She is on olmesartan 20 mg a day.

## 2020-12-24 NOTE — Assessment & Plan Note (Addendum)
History of presumed renal artery FMD with Dopplers performed 02/05/2020 revealing symmetric renal dimensions, slightly elevated right renal artery ratio of 2.55 with mildly elevated velocity of 247.  I doubt this is hemodynamically significant.  We will recheck renal Doppler studies.

## 2020-12-24 NOTE — Assessment & Plan Note (Signed)
History of hyperlipidemia on atorvastatin with lipid profile performed most recently 11/11/2020 revealing total cholesterol 176, LDL of 96 and HDL of 64.

## 2020-12-24 NOTE — Assessment & Plan Note (Signed)
Coronary calcium score of 9 measured 01/12/2020 now on low-dose atorvastatin with an LDL of 96.

## 2021-02-03 ENCOUNTER — Encounter (HOSPITAL_COMMUNITY): Payer: BC Managed Care – PPO

## 2021-02-06 ENCOUNTER — Ambulatory Visit (HOSPITAL_COMMUNITY)
Admission: RE | Admit: 2021-02-06 | Payer: BC Managed Care – PPO | Source: Ambulatory Visit | Attending: Cardiovascular Disease | Admitting: Cardiovascular Disease

## 2021-02-10 ENCOUNTER — Ambulatory Visit (HOSPITAL_COMMUNITY)
Admission: RE | Admit: 2021-02-10 | Discharge: 2021-02-10 | Disposition: A | Discharge status: Home / Self Care | Payer: BC Managed Care – PPO | Source: Ambulatory Visit | Attending: Cardiovascular Disease | Admitting: Cardiovascular Disease

## 2021-02-10 ENCOUNTER — Other Ambulatory Visit: Payer: Self-pay

## 2021-02-10 DIAGNOSIS — I773 Arterial fibromuscular dysplasia: Secondary | ICD-10-CM | POA: Diagnosis not present

## 2021-02-10 DIAGNOSIS — I701 Atherosclerosis of renal artery: Secondary | ICD-10-CM | POA: Diagnosis not present

## 2021-04-23 ENCOUNTER — Ambulatory Visit: Payer: Self-pay

## 2021-04-23 ENCOUNTER — Other Ambulatory Visit: Payer: Self-pay

## 2021-04-23 DIAGNOSIS — Z23 Encounter for immunization: Secondary | ICD-10-CM

## 2021-05-13 DIAGNOSIS — E7800 Pure hypercholesterolemia, unspecified: Secondary | ICD-10-CM | POA: Diagnosis not present

## 2021-05-13 DIAGNOSIS — I150 Renovascular hypertension: Secondary | ICD-10-CM | POA: Diagnosis not present

## 2021-05-13 DIAGNOSIS — R001 Bradycardia, unspecified: Secondary | ICD-10-CM | POA: Diagnosis not present

## 2021-05-13 DIAGNOSIS — I773 Arterial fibromuscular dysplasia: Secondary | ICD-10-CM | POA: Diagnosis not present

## 2021-05-26 DIAGNOSIS — R1031 Right lower quadrant pain: Secondary | ICD-10-CM | POA: Diagnosis not present

## 2021-05-26 DIAGNOSIS — R509 Fever, unspecified: Secondary | ICD-10-CM | POA: Diagnosis not present

## 2021-05-26 DIAGNOSIS — R112 Nausea with vomiting, unspecified: Secondary | ICD-10-CM | POA: Diagnosis not present

## 2021-05-29 DIAGNOSIS — Z1231 Encounter for screening mammogram for malignant neoplasm of breast: Secondary | ICD-10-CM | POA: Diagnosis not present

## 2021-06-11 DIAGNOSIS — R928 Other abnormal and inconclusive findings on diagnostic imaging of breast: Secondary | ICD-10-CM | POA: Diagnosis not present

## 2021-06-11 DIAGNOSIS — R922 Inconclusive mammogram: Secondary | ICD-10-CM | POA: Diagnosis not present

## 2021-06-25 ENCOUNTER — Other Ambulatory Visit: Payer: Self-pay

## 2021-06-25 DIAGNOSIS — N6324 Unspecified lump in the left breast, lower inner quadrant: Secondary | ICD-10-CM | POA: Diagnosis not present

## 2021-06-25 DIAGNOSIS — D0512 Intraductal carcinoma in situ of left breast: Secondary | ICD-10-CM | POA: Diagnosis not present

## 2021-06-25 DIAGNOSIS — N6325 Unspecified lump in the left breast, overlapping quadrants: Secondary | ICD-10-CM | POA: Diagnosis not present

## 2021-06-26 DIAGNOSIS — C50919 Malignant neoplasm of unspecified site of unspecified female breast: Secondary | ICD-10-CM

## 2021-06-26 HISTORY — DX: Malignant neoplasm of unspecified site of unspecified female breast: C50.919

## 2021-06-30 ENCOUNTER — Telehealth: Payer: Self-pay | Admitting: Hematology and Oncology

## 2021-06-30 NOTE — Telephone Encounter (Signed)
Spoke to patient to confirm afternoon clinic appointment for 12/14, [facility] will send paperwork

## 2021-07-04 ENCOUNTER — Encounter: Payer: Self-pay | Admitting: *Deleted

## 2021-07-04 DIAGNOSIS — D0512 Intraductal carcinoma in situ of left breast: Secondary | ICD-10-CM | POA: Insufficient documentation

## 2021-07-08 NOTE — Progress Notes (Signed)
[facility] NOTE  Patient Care Team: Antony Contras, MD as PCP - General (Family Medicine) Sueanne Margarita, MD as PCP - Cardiology (Cardiology) Rockwell Germany, RN as Oncology Nurse Navigator Mauro Kaufmann, RN as Oncology Nurse Navigator Rolm Bookbinder, MD as Consulting Physician (General Surgery) Nicholas Lose, MD as Consulting Physician (Hematology and Oncology) Kyung Rudd, MD as Consulting Physician (Radiation Oncology)  CHIEF COMPLAINTS/PURPOSE OF CONSULTATION:  Newly diagnosed left breast DCIS  HISTORY OF PRESENTING ILLNESS:  Whitney Ford 65 y.o. female is here because of recent diagnosis of DCIS of the left breast. Left breast biopsy on 06/25/2021 showed atypical epithelium 0.5 cm mass at 7:00, and DCIS 1.1 cm mass at 12:00, ER/PR+ (100%). She presents to the clinic today for initial evaluation and discussion of treatment options.   I reviewed her records extensively and collaborated the history with the patient.  SUMMARY OF ONCOLOGIC HISTORY: Oncology History  Ductal carcinoma in situ (DCIS) of left breast  06/25/2021 Initial Diagnosis   Screening mammogram detected left breast abnormalities. biopsy on 06/25/2021 showed atypical epithelium 0.5 cm mass at 7:00, and DCIS low-grade 1.1 cm mass at 12:00, ER/PR+ (100%).      MEDICAL HISTORY:  Past Medical History:  Diagnosis Date   Bradycardia, sinus    Breast cancer (HCC)    Dry eyes    Elevated LDL cholesterol level 12/07/2019   Estrogen deficiency    Fibromuscular dysplasia (HCC)    Hives    Irregular heartbeat    Renovascular hypertension     SURGICAL HISTORY: Past Surgical History:  Procedure Laterality Date   BTL     CHILDBIRTH      SOCIAL HISTORY: Social History   Socioeconomic History   Marital status: Married    Spouse name: Not on file   Number of children: Not on file   Years of education: Not on file   Highest education level: Not on file  Occupational History    Not on file  Tobacco Use   Smoking status: Never   Smokeless tobacco: Never  Substance and Sexual Activity   Alcohol use: Yes    Alcohol/week: 2.0 standard drinks    Types: 2 Glasses of wine per week   Drug use: Never   Sexual activity: Not on file    Comment: charlie  Other Topics Concern   Not on file  Social History Narrative   Not on file   Social Determinants of Health   Financial Resource Strain: Not on file  Food Insecurity: Not on file  Transportation Needs: Not on file  Physical Activity: Not on file  Stress: Not on file  Social Connections: Not on file  Intimate Partner Violence: Not on file    FAMILY HISTORY: Family History  Problem Relation Age of Onset   Hypertension Mother    High Cholesterol Mother    Breast cancer Mother    Stroke Father    Multiple myeloma Father    Diabetes Sister    Prostate cancer Brother    Stroke Maternal Grandmother    Diabetes Paternal Grandmother    Cancer - Other Paternal Grandfather        brain    ALLERGIES:  has No Known Allergies.  MEDICATIONS:  Current Outpatient Medications  Medication Sig Dispense Refill   atorvastatin (LIPITOR) 20 MG tablet Take 1 tablet (20 mg total) by mouth daily. 30 tablet 6   Calcium Carbonate 500 MG CHEW at bedtime. 2 chews at bedtime  nightly     cetirizine (ZYRTEC) 10 MG tablet Take 10 mg by mouth daily.     cholecalciferol (VITAMIN D) 25 MCG (1000 UNIT) tablet Take by mouth daily.     Multiple Vitamin (MULTIVITAMIN) tablet Take 1 tablet by mouth daily.     olmesartan (BENICAR) 20 MG tablet Take 20 mg by mouth daily.     Omega-3 Fatty Acids (FISH OIL) 1200 MG CAPS Take by mouth.     No current facility-administered medications for this visit.    REVIEW OF SYSTEMS:   Constitutional: Denies fevers, chills or abnormal night sweats Eyes: Denies blurriness of vision, double vision or watery eyes Ears, nose, mouth, throat, and face: Denies mucositis or sore throat Respiratory: Denies  cough, dyspnea or wheezes Cardiovascular: Denies palpitation, chest discomfort or lower extremity swelling Gastrointestinal:  Denies nausea, heartburn or change in bowel habits Skin: Denies abnormal skin rashes Lymphatics: Denies new lymphadenopathy or easy bruising Neurological:Denies numbness, tingling or new weaknesses Behavioral/Psych: Mood is stable, no new changes  Breast:  Denies any palpable lumps or discharge All other systems were reviewed with the patient and are negative.  PHYSICAL EXAMINATION: ECOG PERFORMANCE STATUS: 0 - Asymptomatic  Vitals:   07/09/21 1250  BP: (!) 141/80  Pulse: (!) 51  Resp: 18  Temp: (!) 97.2 F (36.2 C)  SpO2: 100%   Filed Weights   07/09/21 1250  Weight: 164 lb 8 oz (74.6 kg)    LABORATORY DATA:  I have reviewed the data as listed Lab Results  Component Value Date   WBC 6.1 07/09/2021   HGB 13.3 07/09/2021   HCT 40.3 07/09/2021   MCV 93.9 07/09/2021   PLT 219 07/09/2021   Lab Results  Component Value Date   NA 144 07/09/2021   K 4.7 07/09/2021   CL 106 07/09/2021   CO2 30 07/09/2021    RADIOGRAPHIC STUDIES: I have personally reviewed the radiological reports and agreed with the findings in the report.  ASSESSMENT AND PLAN:  Ductal carcinoma in situ (DCIS) of left breast 06/25/2021:Screening mammogram detected left breast abnormalities. biopsy on 06/25/2021 showed atypical epithelium 0.5 cm mass at 7:00, and DCIS low-grade 1.1 cm mass at 12:00, ER/PR+ (100%).   Pathology review: I discussed with the patient the difference between DCIS and invasive breast cancer. It is considered a precancerous lesion. DCIS is classified as a 0. It is generally detected through mammograms as calcifications. We discussed the significance of grades and its impact on prognosis. We also discussed the importance of ER and PR receptors and their implications to adjuvant treatment options. Prognosis of DCIS dependence on grade, comedo necrosis. It is  anticipated that if not treated, 20-30% of DCIS can develop into invasive breast cancer.  Recommendation: 1. Breast conserving surgery 2. Followed by adjuvant radiation therapy 3. Followed by antiestrogen therapy with tamoxifen 5 years  Tamoxifen counseling: We discussed the risks and benefits of tamoxifen. These include but not limited to insomnia, hot flashes, mood changes, vaginal dryness, and weight gain. Although rare, serious side effects including endometrial cancer, risk of blood clots were also discussed. We strongly believe that the benefits far outweigh the risks. Patient understands these risks and consented to starting treatment. Planned treatment duration is 5 years.  Return to clinic after surgery to discuss the final pathology report and come up with an adjuvant treatment plan.    All questions were answered. The patient knows to call the clinic with any problems, questions or concerns.   Rogan Ecklund  Loyal Gambler, MD, MPH 07/09/2021    I, Thana Ates, am acting as scribe for Nicholas Lose, MD.  I have reviewed the above documentation for accuracy and completeness, and I agree with the above.

## 2021-07-09 ENCOUNTER — Encounter: Payer: Self-pay | Admitting: Family Medicine

## 2021-07-09 ENCOUNTER — Ambulatory Visit (HOSPITAL_BASED_OUTPATIENT_CLINIC_OR_DEPARTMENT_OTHER): Payer: Medicare Other | Admitting: Genetic Counselor

## 2021-07-09 ENCOUNTER — Encounter: Payer: Self-pay | Admitting: Hematology and Oncology

## 2021-07-09 ENCOUNTER — Ambulatory Visit: Payer: Medicare Other | Admitting: Physical Therapy

## 2021-07-09 ENCOUNTER — Encounter: Payer: Self-pay | Admitting: General Practice

## 2021-07-09 ENCOUNTER — Other Ambulatory Visit: Payer: Self-pay

## 2021-07-09 ENCOUNTER — Inpatient Hospital Stay: Payer: Medicare Other | Attending: Hematology and Oncology

## 2021-07-09 ENCOUNTER — Inpatient Hospital Stay (HOSPITAL_BASED_OUTPATIENT_CLINIC_OR_DEPARTMENT_OTHER): Payer: Medicare Other | Admitting: Hematology and Oncology

## 2021-07-09 ENCOUNTER — Encounter: Payer: Self-pay | Admitting: *Deleted

## 2021-07-09 ENCOUNTER — Encounter: Payer: Self-pay | Admitting: Genetic Counselor

## 2021-07-09 ENCOUNTER — Ambulatory Visit
Admission: RE | Admit: 2021-07-09 | Discharge: 2021-07-09 | Disposition: A | Discharge status: Home / Self Care | Payer: Medicare Other | Source: Ambulatory Visit | Attending: Radiation Oncology | Admitting: Radiation Oncology

## 2021-07-09 DIAGNOSIS — Z170 Estrogen receptor positive status [ER+]: Secondary | ICD-10-CM | POA: Insufficient documentation

## 2021-07-09 DIAGNOSIS — D0512 Intraductal carcinoma in situ of left breast: Secondary | ICD-10-CM | POA: Diagnosis not present

## 2021-07-09 DIAGNOSIS — Z8042 Family history of malignant neoplasm of prostate: Secondary | ICD-10-CM | POA: Insufficient documentation

## 2021-07-09 DIAGNOSIS — Z803 Family history of malignant neoplasm of breast: Secondary | ICD-10-CM

## 2021-07-09 DIAGNOSIS — Z808 Family history of malignant neoplasm of other organs or systems: Secondary | ICD-10-CM

## 2021-07-09 LAB — CMP (CANCER CENTER ONLY)
ALT: 37 U/L (ref 0–44)
AST: 36 U/L (ref 15–41)
Albumin: 4.1 g/dL (ref 3.5–5.0)
Alkaline Phosphatase: 89 U/L (ref 38–126)
Anion gap: 8 (ref 5–15)
BUN: 16 mg/dL (ref 8–23)
CO2: 30 mmol/L (ref 22–32)
Calcium: 9.7 mg/dL (ref 8.9–10.3)
Chloride: 106 mmol/L (ref 98–111)
Creatinine: 0.82 mg/dL (ref 0.44–1.00)
GFR, Estimated: >60 mL/min (ref >60)
Glucose, Bld: 82 mg/dL (ref 70–99)
Potassium: 4.7 mmol/L (ref 3.5–5.1)
Sodium: 144 mmol/L (ref 135–145)
Total Bilirubin: 0.4 mg/dL (ref 0.3–1.2)
Total Protein: 6.9 g/dL (ref 6.5–8.1)

## 2021-07-09 LAB — CBC WITH DIFFERENTIAL (CANCER CENTER ONLY)
Abs Immature Granulocytes: 0.01 10*3/uL (ref 0.00–0.07)
Basophils Absolute: 0 10*3/uL (ref 0.0–0.1)
Basophils Relative: 1 %
Eosinophils Absolute: 0.1 10*3/uL (ref 0.0–0.5)
Eosinophils Relative: 2 %
HCT: 40.3 % (ref 36.0–46.0)
Hemoglobin: 13.3 g/dL (ref 12.0–15.0)
Immature Granulocytes: 0 %
Lymphocytes Relative: 36 %
Lymphs Abs: 2.2 10*3/uL (ref 0.7–4.0)
MCH: 31 pg (ref 26.0–34.0)
MCHC: 33 g/dL (ref 30.0–36.0)
MCV: 93.9 fL (ref 80.0–100.0)
Monocytes Absolute: 0.7 10*3/uL (ref 0.1–1.0)
Monocytes Relative: 11 %
Neutro Abs: 3.1 10*3/uL (ref 1.7–7.7)
Neutrophils Relative %: 50 %
Platelet Count: 219 10*3/uL (ref 150–400)
RBC: 4.29 MIL/uL (ref 3.87–5.11)
RDW: 12.8 % (ref 11.5–15.5)
WBC Count: 6.1 10*3/uL (ref 4.0–10.5)
nRBC: 0 % (ref 0.0–0.2)

## 2021-07-09 LAB — GENETIC SCREENING ORDER

## 2021-07-09 NOTE — Progress Notes (Signed)
REFERRING PROVIDER: Nicholas Lose, MD [address] [place], [place] [address]  PRIMARY PROVIDER:  Antony Contras, MD  PRIMARY REASON FOR VISIT:  1. Ductal carcinoma in situ (DCIS) of left breast   2. Family history of breast cancer   3. Family history of prostate cancer     HISTORY OF PRESENT ILLNESS:   Whitney Ford, a 65 y.o. female, was seen for a [place] cancer genetics consultation during the breast multidisciplinary clinic at the request of Dr. Moreen Ford due to a personal and family history of cancer.  Whitney Ford presents to clinic today to discuss the possibility of a hereditary predisposition to cancer, to discuss genetic testing, and to further clarify her future cancer risks, as well as potential cancer risks for family members.   In November 2022, at the age of 65, Whitney Ford was diagnosed with ductal carcinoma in situ of the left breast. The current treatment plan is breast conservation followed by adjuvant radiation therapy and antiestrogen therapy.   CANCER HISTORY:  Oncology History  Ductal carcinoma in situ (DCIS) of left breast  06/25/2021 Initial Diagnosis   Screening mammogram detected left breast abnormalities. biopsy on 06/25/2021 showed atypical epithelium 0.5 cm mass at 7:00, and DCIS low-grade 1.1 cm mass at 12:00, ER/PR+ (100%).      RISK FACTORS:  Menarche was at age 73.  First live birth at age 38.  OCP use for approximately 0 years.  Ovaries intact: yes.  Uterus intact: yes.  Menopausal status: postmenopausal.  HRT use: 0 years. Colonoscopy: yes; 2017 Mammogram within the last year: yes. Any excessive radiation exposure in the past: no  Past Medical History:  Diagnosis Date   Bradycardia, sinus    Breast cancer (HCC)    Dry eyes    Elevated LDL cholesterol level 12/07/2019   Estrogen deficiency    Fibromuscular dysplasia (HCC)    Hives    Irregular heartbeat    Renovascular hypertension     Past Surgical History:  Procedure Laterality  Date   BTL     CHILDBIRTH      Social History   Socioeconomic History   Marital status: Married    Spouse name: Not on file   Number of children: Not on file   Years of education: Not on file   Highest education level: Not on file  Occupational History   Not on file  Tobacco Use   Smoking status: Never   Smokeless tobacco: Never  Substance and Sexual Activity   Alcohol use: Yes    Alcohol/week: 2.0 standard drinks    Types: 2 Glasses of wine per week   Drug use: Never   Sexual activity: Not on file    Comment: charlie  Other Topics Concern   Not on file  Social History Narrative   Not on file   Social Determinants of Health   Financial Resource Strain: Not on file  Food Insecurity: Not on file  Transportation Needs: Not on file  Physical Activity: Not on file  Stress: Not on file  Social Connections: Not on file     FAMILY HISTORY:  We obtained a detailed, 4-generation family history.  Significant diagnoses are listed below: Family History  Problem Relation Age of Onset   Hypertension Mother    High Cholesterol Mother    Breast cancer Mother 55   Stroke Father    Multiple myeloma Father 34   Diabetes Sister    Prostate cancer Brother  dx. mid 54s   Stroke Maternal Grandmother    Diabetes Paternal Grandmother    Melanoma Daughter 33     Ms. 33 daughter, Whitney Ford, was diagnosed with melanoma at age 79. Ms. Catalina brother was diagnosed with prostate cancer in his mid 71s and her mother was diagnosed with breast cancer at 33. Her father was diagnosed with multiple myeloma at 62  [name] is unaware of previous family history of genetic testing for hereditary cancer risks. There is no reported Ashkenazi Jewish ancestry.  GENETIC COUNSELING ASSESSMENT: Whitney Ford is a 65 y.o. female with a personal and family history of cancer which is somewhat suggestive of a hereditary cancer syndrome and predisposition to cancer given multiple generations affected with  breast and/or prostate cancer. We, therefore, discussed and recommended the following at today's visit.   DISCUSSION: We discussed that 5 - 10% of cancer is hereditary, with most cases of hereditary breast cancer associated with mutations in BRCA1/2.  There are other genes that can be associated with hereditary breast cancer syndromes. Type of cancer risk and level of risk are gene-specific. We discussed that testing is beneficial for several reasons including knowing how to follow individuals after completing their treatment, identifying whether potential treatment options would be beneficial, and understanding if other family members could be at risk for cancer and allowing them to undergo genetic testing.   We reviewed the characteristics, features and inheritance patterns of hereditary cancer syndromes. We also discussed genetic testing, including the appropriate family members to test, the process of testing, insurance coverage and turn-around-time for results. We discussed the implications of a negative, positive and/or variant of uncertain significant result. In order to get genetic test results in a timely manner so that Whitney Ford can use these genetic test results for surgical decisions, we recommended Whitney Ford pursue genetic testing for the BRCAplus. Once complete, we recommend Whitney Ford pursue reflex genetic testing to a more comprehensive gene panel.   Whitney Ford  was offered a common hereditary cancer panel (47 genes) and an expanded pan-cancer panel (77 genes). Whitney Ford was informed of the benefits and limitations of each panel, including that expanded pan-cancer panels contain genes that do not have clear management guidelines at this point in time.  We also discussed that as the number of genes included on a panel increases, the chances of variants of uncertain significance increases.  After considering the benefits and limitations of each gene panel, Whitney Ford elected to have [facility]  CancerNext-Expanded Panel+RNA.   The CancerNext-Expanded gene panel offered by [facility] and includes sequencing, rearrangement, and RNA analysis for the following 77 genes: AIP, ALK, APC, ATM, AXIN2, BAP1, BARD1, BLM, BMPR1A, BRCA1, BRCA2, BRIP1, CDC73, CDH1, CDK4, CDKN1B, CDKN2A, CHEK2, CTNNA1, DICER1, FANCC, FH, FLCN, GALNT12, KIF1B, LZTR1, MAX, MEN1, MET, MLH1, MSH2, MSH3, MSH6, MUTYH, NBN, NF1, NF2, NTHL1, PALB2, PHOX2B, PMS2, POT1, PRKAR1A, PTCH1, PTEN, RAD51C, RAD51D, RB1, RECQL, RET, SDHA, SDHAF2, SDHB, SDHC, SDHD, SMAD4, SMARCA4, SMARCB1, SMARCE1, STK11, SUFU, TMEM127, TP53, TSC1, TSC2, VHL and XRCC2 (sequencing and deletion/duplication); EGFR, EGLN1, HOXB13, KIT, MITF, PDGFRA, POLD1, and POLE (sequencing only); EPCAM and GREM1 (deletion/duplication only).    Based on Ms. 38 personal and family history of cancer, she meets medical criteria for genetic testing. Despite that she meets criteria, she may still have an out of pocket cost. We discussed that if her out of pocket cost for testing is over $100, the laboratory should contact them to discuss self-pay prices, patient pay assistance  programs, if applicable, and other billing options.   PLAN: After considering the risks, benefits, and limitations, Whitney Ford provided informed consent to pursue genetic testing and the blood sample was sent to [facility] for analysis of the CancerNext-Expanded Panel+RNA. Results should be available within approximately 1-2 weeks' time, at which point they will be disclosed by telephone to Whitney Ford, as will any additional recommendations warranted by these results. Whitney Ford will receive a summary of her genetic counseling visit and a copy of her results once available. This information will also be available in Epic.   Whitney Ford questions were answered to her satisfaction today. Our contact information was provided should additional questions or concerns arise. Thank you for the referral and allowing  [place] to share in the care of your patient.   Lucille Passy, MS, [facility] Genetic Counselor [email or web address] (P) [phone]  The patient was seen for a total of 20 minutes in face-to-face genetic counseling.  The patient brought her husband. Drs. Magrinat, Lindi Adie and/or Burr Medico were available to discuss this case as needed.  _______________________________________________________________________ For Office Staff:  Number of people involved in session: 2 Was an Intern/ student involved with case: no

## 2021-07-09 NOTE — Progress Notes (Signed)
[place] Psychosocial Distress Screening Spiritual Care  Met with Luciel "Jan" and her husband in [facility] to introduce [facility] team/resources, reviewing distress screen per protocol.  The patient scored a 2 on the Psychosocial Distress Thermometer which indicates mild distress. Also assessed for distress and other psychosocial needs.   ONCBCN DISTRESS SCREENING 07/09/2021  Screening Type Initial Screening  Distress experienced in past week (1-10) 2  Information Concerns Type Lack of info about diagnosis;Lack of info about treatment;Lack of info about complementary therapy choices;Lack of info about maintaining fitness  Physical Problem type Sleep/insomnia  Referral to support programs Yes   Ms Sockwell has recently retired and is working on the identity shift that comes with this major life change. She is engaging in more hobbies, including [place]. She reports low distress, high confidence in her team and plan, and anticipation that she will sleep better tonight.  Follow up needed: No. Per Ms Zill, no other needs at this time, but she is aware of ongoing [place] support team and programming availability, including [employer] healing arts programs.   [place], [place], [facility] Pager [phone] Voicemail [phone]

## 2021-07-09 NOTE — Assessment & Plan Note (Signed)
06/25/2021:Screening mammogram detected left breast abnormalities. biopsy on 06/25/2021 showed atypical epithelium 0.5 cm mass at 7:00, and DCIS low-grade 1.1 cm mass at 12:00, ER/PR+ (100%).   Pathology review: I discussed with the patient the difference between DCIS and invasive breast cancer. It is considered a precancerous lesion. DCIS is classified as a 0. It is generally detected through mammograms as calcifications. We discussed the significance of grades and its impact on prognosis. We also discussed the importance of ER and PR receptors and their implications to adjuvant treatment options. Prognosis of DCIS dependence on grade, comedo necrosis. It is anticipated that if not treated, 20-30% of DCIS can develop into invasive breast cancer.  Recommendation: 1. Breast conserving surgery 2. Followed by adjuvant radiation therapy 3. Followed by antiestrogen therapy with tamoxifen 5 years  Tamoxifen counseling: We discussed the risks and benefits of tamoxifen. These include but not limited to insomnia, hot flashes, mood changes, vaginal dryness, and weight gain. Although rare, serious side effects including endometrial cancer, risk of blood clots were also discussed. We strongly believe that the benefits far outweigh the risks. Patient understands these risks and consented to starting treatment. Planned treatment duration is 5 years.  Return to clinic after surgery to discuss the final pathology report and come up with an adjuvant treatment plan.

## 2021-07-09 NOTE — Progress Notes (Addendum)
Radiation Oncology         [phone] ________________________________  Name: Whitney Ford        MRN: [id number]  Date of Service: 07/09/2021 DOB: [birth year]  TK:[id number], Shanon Brow, MD  Rolm Bookbinder, MD     REFERRING PHYSICIAN: Rolm Bookbinder, MD   DIAGNOSIS: The encounter diagnosis was Ductal carcinoma in situ (DCIS) of left breast.   HISTORY OF PRESENT ILLNESS: Whitney Ford is a 65 y.o. female seen in the multidisciplinary breast clinic for a new diagnosis of left breast cancer. The patient was noted to have focal asymmetry on screening mammogram in the left breast at 12:00 as well as a lesion in the 7 o'clock position.  She underwent diagnostic imaging, by ultrasound the 7:00 area measured 5 mm in the 12:00 lesion measured 1.1 cm.  Her left axilla was negative for adenopathy.  She underwent a biopsy of both sites on 06/25/2021.  Final pathology of the 7:00 lesion showed atypical epithelium within a complex sclerosing lesion and at 12:00 a papillary lesion containing low-grade DCIS that was ER/PR positive.  Given these findings she is seen to discuss treatment recommendations of her cancer.  PREVIOUS RADIATION THERAPY: No   PAST MEDICAL HISTORY:  Past Medical History:  Diagnosis Date   Bradycardia, sinus    Dry eyes    Elevated LDL cholesterol level 12/07/2019   Estrogen deficiency    Fibromuscular dysplasia (HCC)    Hives    Irregular heartbeat    Renovascular hypertension        PAST SURGICAL HISTORY: Past Surgical History:  Procedure Laterality Date   BTL     CHILDBIRTH       FAMILY HISTORY:  Family History  Problem Relation Age of Onset   Hypertension Mother    High Cholesterol Mother    Stroke Father    Multiple myeloma Father    Diabetes Sister    Stroke Maternal Grandmother    Diabetes Paternal Grandmother    Cancer - Other Paternal Grandfather        brain     SOCIAL HISTORY:  reports that she has never smoked. She has never used smokeless  tobacco.  The patient is married and lives in [place].  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 She enjoys exercising regularly.    ALLERGIES: Patient has no known allergies.   MEDICATIONS:  Current Outpatient Medications  Medication Sig Dispense Refill   atorvastatin (LIPITOR) 20 MG tablet Take 1 tablet (20 mg total) by mouth daily. 30 tablet 6   Calcium Carbonate 500 MG CHEW at bedtime. 2 chews at bedtime nightly     cetirizine (ZYRTEC) 10 MG tablet Take 10 mg by mouth daily.     cholecalciferol (VITAMIN D) 25 MCG (1000 UNIT) tablet Take by mouth daily.     Multiple Vitamin (MULTIVITAMIN) tablet Take 1 tablet by mouth daily.     olmesartan (BENICAR) 20 MG tablet Take 20 mg by mouth daily.     Omega-3 Fatty Acids (FISH OIL) 1200 MG CAPS Take by mouth.     No current facility-administered medications for this encounter.     REVIEW OF SYSTEMS: On review of systems, the patient reports that she is doing well overall without any specific breast complaints.      PHYSICAL EXAM:  Wt Readings from Last 3 Encounters:  12/24/20 158 lb 9.6 oz (71.9 kg)  01/03/20 158 lb 6.4 oz (71.8 kg)  12/12/19 159  lb 6.4 oz (72.3 kg)   Temp Readings from Last 3 Encounters:  No data found for Temp   BP Readings from Last 3 Encounters:  12/24/20 110/78  01/03/20 114/78  12/12/19 110/76   Pulse Readings from Last 3 Encounters:  12/24/20 61  01/03/20 (!) 54  12/12/19 (!) 51    In general this is a well appearing caucasian female in no acute distress. She's alert and oriented x4 and appropriate throughout the examination. Cardiopulmonary assessment is negative for acute distress and she exhibits normal effort. Bilateral breast exam is deferred.    ECOG = 0  0 - Asymptomatic (Fully active, able to carry on all predisease activities without restriction)  1 - Symptomatic but completely ambulatory (Restricted in physically strenuous activity but ambulatory and  able to carry out work of a light or sedentary nature. For example, light housework, office work)  2 - Symptomatic, <50% in bed during the day (Ambulatory and capable of all self care but unable to carry out any work activities. Up and about more than 50% of waking hours)  3 - Symptomatic, >50% in bed, but not bedbound (Capable of only limited self-care, confined to bed or chair 50% or more of waking hours)  4 - Bedbound (Completely disabled. Cannot carry on any self-care. Totally confined to bed or chair)  5 - Death   Eustace Pen MM, Creech RH, Tormey DC, et al. [phone]). "Toxicity and response criteria of the [facility]". [place] Oncol. 5 (6): 649-55    LABORATORY DATA:  No results found for: WBC, HGB, HCT, MCV, PLT No results found for: NA, K, CL, CO2 No results found for: ALT, AST, GGT, ALKPHOS, BILITOT    RADIOGRAPHY: No results found.     IMPRESSION/PLAN: 1. ER/PR positive, low-grade DCIS of the left breast. Dr. Lisbeth Renshaw discusses the pathology findings and reviews the nature of noninvasive left breast disease. The consensus from the breast conference includes breast conservation with bracketed left lumpectomy versus MRI for extent of disease prior to breast conserving surgery.  Dr. Lisbeth Renshaw discusses the rationale for adjuvant external radiotherapy to the breast  to reduce risks of local recurrence followed by antiestrogen therapy. We discussed the risks, benefits, short, and long term effects of radiotherapy, as well as the curative intent, and the patient is interested in proceeding. Dr. Lisbeth Renshaw discusses the delivery and logistics of radiotherapy and anticipates a course of 4 weeks of radiotherapy to the left breast with deep inspiration breath-hold technique. We will see her back a few weeks after surgery to discuss the simulation process and anticipate we starting radiotherapy about 4-6 weeks after surgery.  2. Possible genetic predisposition to malignancy. The  patient is a candidate for genetic testing given her personal and family history. She was offered referral and will meet with genetics today to review options of testing.  In a visit lasting 60 minutes, greater than 50% of the time was spent face to face reviewing her case, as well as in preparation of, discussing, and coordinating the patient's care.  The above documentation reflects my direct findings during this shared patient visit. Please see the separate note by Dr. Lisbeth Renshaw on this date for the remainder of the patient's plan of care.    Carola Rhine, [facility]    **Disclaimer: This note was dictated with voice recognition software. Similar sounding words can inadvertently be transcribed and this note may contain transcription errors which may not have been corrected upon publication of note.**

## 2021-07-14 ENCOUNTER — Other Ambulatory Visit: Payer: Self-pay

## 2021-07-14 ENCOUNTER — Ambulatory Visit
Admission: RE | Admit: 2021-07-14 | Discharge: 2021-07-14 | Disposition: A | Discharge status: Home / Self Care | Payer: Medicare Other | Source: Ambulatory Visit | Attending: General Surgery | Admitting: General Surgery

## 2021-07-14 DIAGNOSIS — N6313 Unspecified lump in the right breast, lower outer quadrant: Secondary | ICD-10-CM | POA: Diagnosis not present

## 2021-07-14 DIAGNOSIS — D0512 Intraductal carcinoma in situ of left breast: Secondary | ICD-10-CM

## 2021-07-14 DIAGNOSIS — N6311 Unspecified lump in the right breast, upper outer quadrant: Secondary | ICD-10-CM | POA: Diagnosis not present

## 2021-07-14 DIAGNOSIS — K7689 Other specified diseases of liver: Secondary | ICD-10-CM | POA: Diagnosis not present

## 2021-07-14 DIAGNOSIS — N6320 Unspecified lump in the left breast, unspecified quadrant: Secondary | ICD-10-CM | POA: Diagnosis not present

## 2021-07-14 IMAGING — MR MR BREAST BILAT WO/W CM
8 of 13 series · 31 of 48 positions shown · IV contrast (gadavist)
Comparison: No prior MRI.

CLINICAL DATA: 65-year-old with biopsy-proven low-grade DCIS and a
papillary lesion in the upper LEFT breast at 12 o'clock (associated
with a Venus clip) and biopsy-proven atypia within a possible
papillary lesion in the LOWER INNER QUADRANT of the LEFT breast at 7
o'clock (associated with a heart clip). Pretreatment MRI evaluation.

EXAM:
BILATERAL BREAST MRI WITH AND WITHOUT CONTRAST
TECHNIQUE: Multiplanar, multisequence MR images of both breasts were obtained
prior to and following the intravenous administration of 7 ml of
Gadavist.

[Series 2: t2_tirm_tra ipat (a-p) · axial · 3.0mm · 0.70mm/px · 1 of 60 slices shown]
[im 1/60]
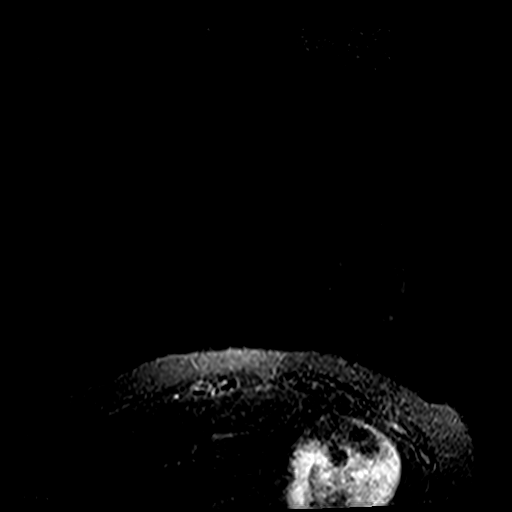

[Series 3: fl3d pre-cm no · axial · non-contrast · 1.2mm · 0.89mm/px · z∈[-63,+109]mm · 5 of 144 slices shown]
[im 1/144]
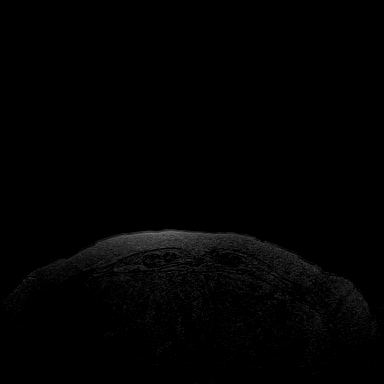
[im 36/144]
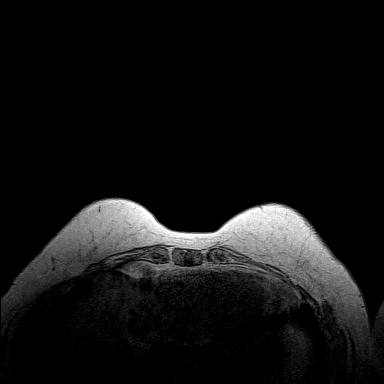
[im 72/144]
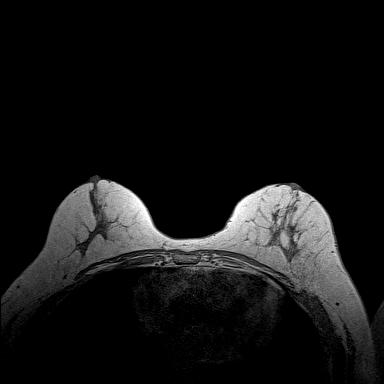
[im 108/144]
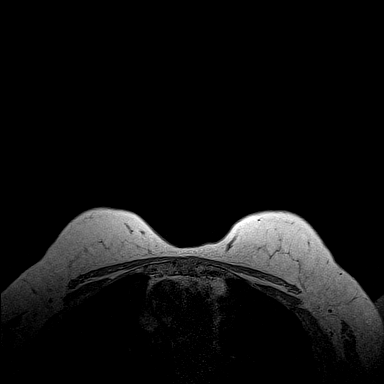
[im 144/144]
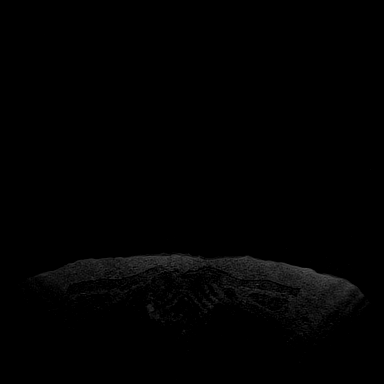

[Series 4: fl3d pre-cm · axial · non-contrast · 1.2mm · 0.89mm/px · z∈[-63,+109]mm · 5 of 144 slices shown]
[im 1/144]
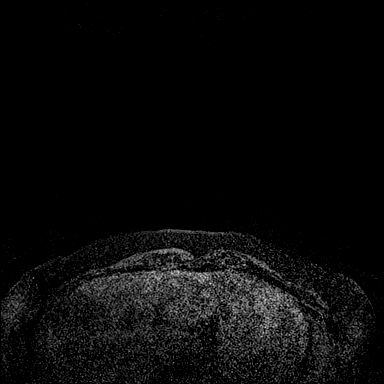
[im 36/144]
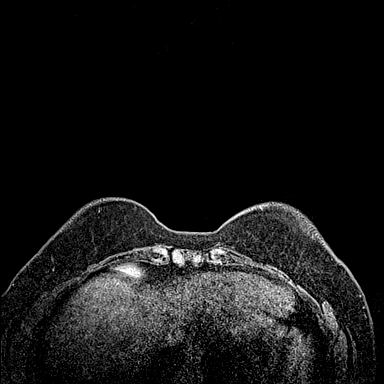
[im 72/144]
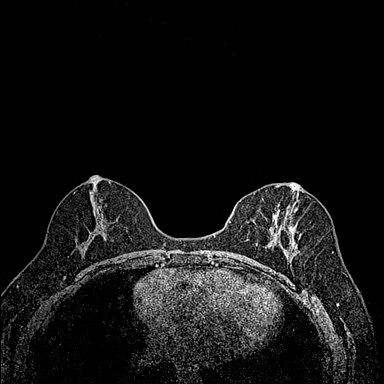
[im 108/144]
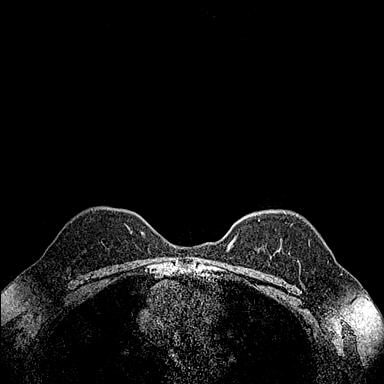
[im 144/144]
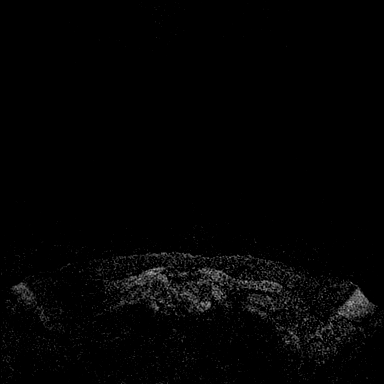

[Series 5: fl3d post-cm 20 · axial · 1.2mm · 0.89mm/px · z∈[-63,+109]mm · 5 of 144 slices shown (1 of 3)]
[im 1/144]
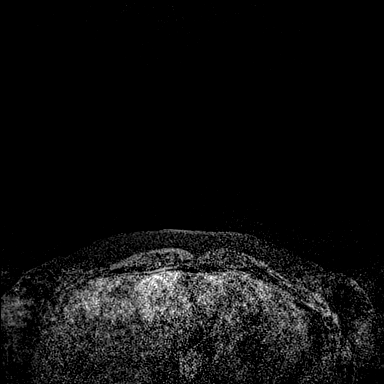
[im 36/144]
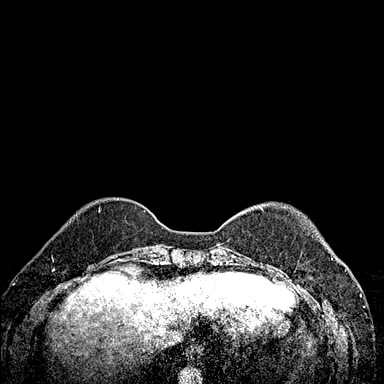
[im 72/144]
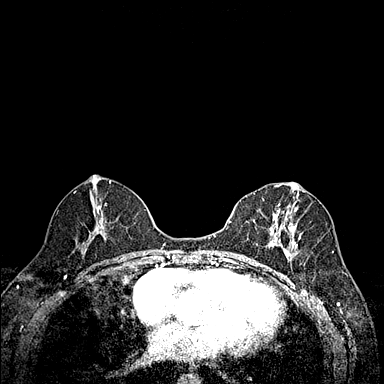
[im 108/144]
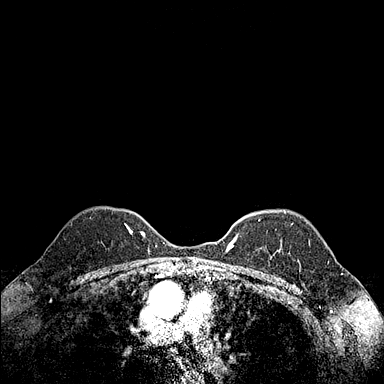
[im 144/144]
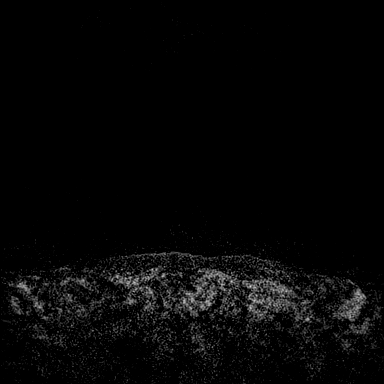

[Series 6: fl3d post-cm 20 · axial · 1.2mm · 0.89mm/px · z∈[-63,+109]mm · 5 of 144 slices shown (2 of 3)]
[im 1/144]
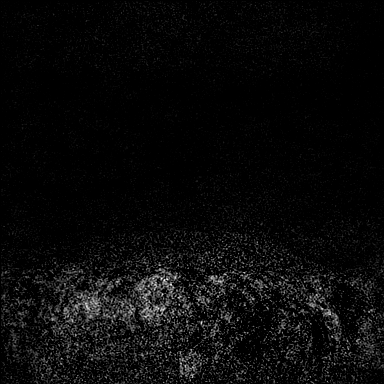
[im 36/144]
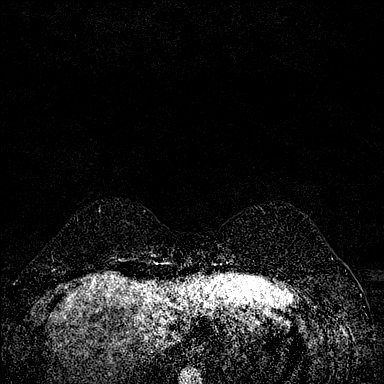
[im 72/144]
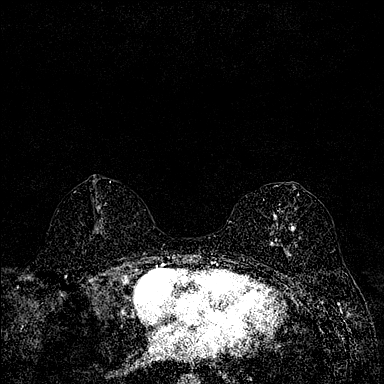
[im 108/144]
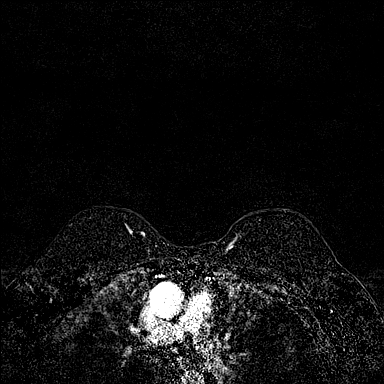
[im 144/144]
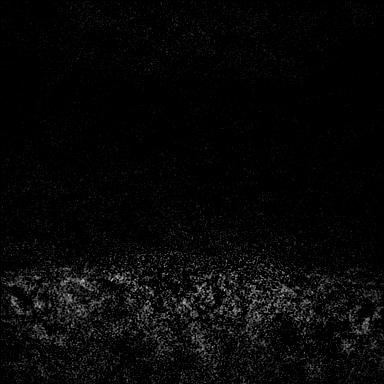

[Series 7: fl3d post-cm 20 · axial · 172.8mm · 0.89mm/px · 1 of 1 slices shown (3 of 3)]
[im 1/1]
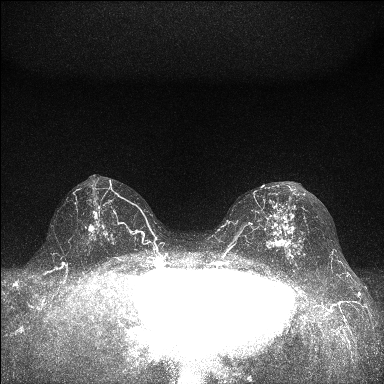

[Series 8: fl3d post-cm 3 · axial · 1.2mm · 0.89mm/px · z∈[-63,+109]mm · 5 of 144 slices shown (1 of 2)]
[im 1/144]
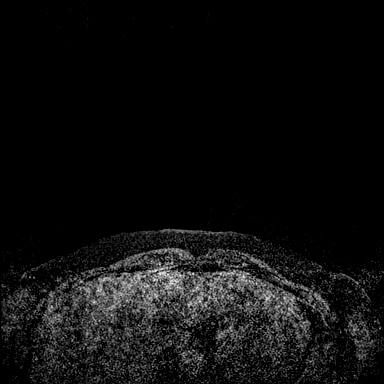
[im 36/144]
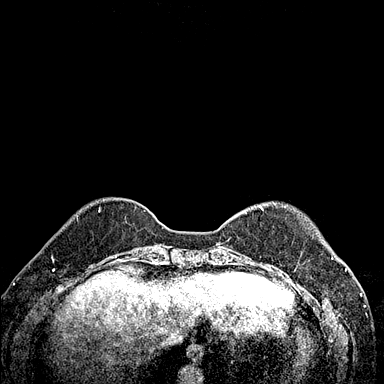
[im 72/144]
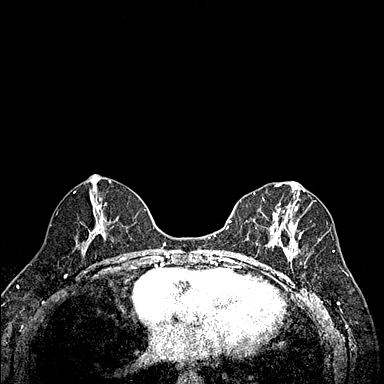
[im 108/144]
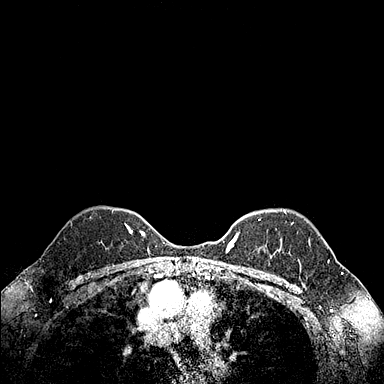
[im 144/144]
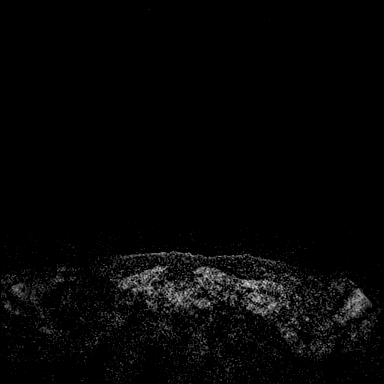

[Series 9: fl3d post-cm 3 · axial · 1.2mm · 0.89mm/px · z∈[-63,+39]mm · 4 of 144 slices shown (2 of 2)]
[im 1/144]
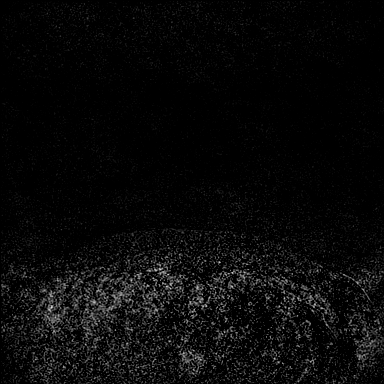
[im 29/144]
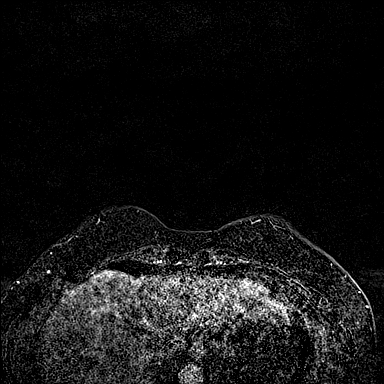
[im 58/144]
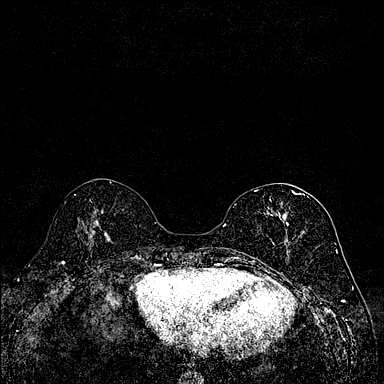
[im 86/144]
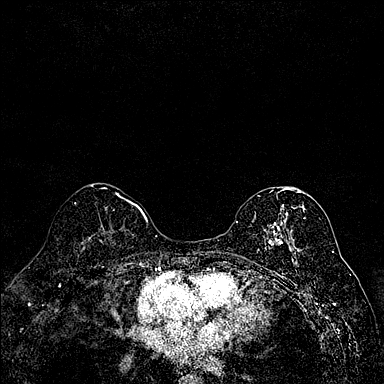

[31 of 48 positions shown; findings below may reference images not displayed]

Three-dimensional MR images were rendered by post-processing of the
original MR data on an independent workstation. The
three-dimensional MR images were interpreted, and findings are
reported in the following complete MRI report for this study. Three
dimensional images were evaluated at the independent interpreting
workstation using the DynaCAD thin client.
Multiple prior mammograms from [facility] in [facility]HOSPITAL], [facility]HOSP[facility]], most recently at the time
of biopsy on [DATE].
FINDINGS: Breast composition: b. Scattered fibroglandular tissue.

Background parenchymal enhancement: Moderate.

Right breast: Enhancing mass in the retroareolar location at middle
depth, near 9 o'clock position, measuring 0.6 x 0.6 x 0.4 cm (AP x
transverse x craniocaudal), demonstrating washout kinetics.

No suspicious mass or abnormal enhancement elsewhere. Numerous
enhancing foci elsewhere indicating background parenchymal
enhancement.

Left breast: Enhancing mass in the upper breast at middle to
posterior depth, 12 o'clock location, biopsy-proven DCIS, associated
with blooming artifact from the [facility] tissue marking clip, measuring
approximately 1.4 x 1.0 x 0.9 cm, demonstrating plateau kinetics.

Enhancing mass in the LOWER INNER QUADRANT, 7 o'clock location,
biopsy-proven atypia possibly in a papillary lesion, associated with
blooming artifact from the heart shaped tissue marking clip,
measuring approximally 0.9 x 0.6 x 0.6 cm, demonstrating plateau
kinetics.

No suspicious mass or abnormal enhancement elsewhere. Numerous
enhancing foci elsewhere indicating background parenchymal
enhancement.

Lymph nodes: No pathologic lymphadenopathy.

Ancillary findings: Benign 0.9 cm cyst in the lateral segment LEFT
lobe of liver. No significant ancillary findings.
IMPRESSION: 1. Indeterminate 0.6 cm enhancing mass in the retroareolar RIGHT
breast at middle depth, near 9 o'clock position.
2. Biopsy-proven DCIS involving the upper LEFT breast at the 12
o'clock position and biopsy-proven atypia in the LOWER INNER
QUADRANT of the LEFT breast at the 7 o'clock position.
3. No suspicious findings elsewhere in the LEFT breast.
4. No pathologic lymphadenopathy.
5. Benign 0.9 cm cyst in the lateral segment LEFT lobe of liver.

RECOMMENDATION:
MRI guided biopsy of the indeterminate mass in the RIGHT breast.

BI-RADS CATEGORY  4: Suspicious.

## 2021-07-14 MED ORDER — GADOBUTROL 1 MMOL/ML IV SOLN
7.0000 mL | Freq: Once | INTRAVENOUS | Status: AC | PRN
  Administered 2021-07-14: 11:00:00 7 mL via INTRAVENOUS

## 2021-07-15 ENCOUNTER — Other Ambulatory Visit: Payer: Self-pay | Admitting: Hematology and Oncology

## 2021-07-15 ENCOUNTER — Telehealth: Payer: Self-pay | Admitting: *Deleted

## 2021-07-15 ENCOUNTER — Encounter: Payer: Self-pay | Admitting: *Deleted

## 2021-07-15 ENCOUNTER — Other Ambulatory Visit: Payer: Self-pay | Admitting: General Surgery

## 2021-07-15 DIAGNOSIS — R928 Other abnormal and inconclusive findings on diagnostic imaging of breast: Secondary | ICD-10-CM

## 2021-07-15 NOTE — Telephone Encounter (Signed)
Spoke to pt concerning [place] from 12.14.22. Denies questions or concerns regarding dx or treatment care plan. Discussed MRI results and need for RIGHT MRI bx. Received verbal understanding. Encourage pt to call with needs.

## 2021-07-17 ENCOUNTER — Other Ambulatory Visit: Payer: Self-pay

## 2021-07-17 ENCOUNTER — Ambulatory Visit
Admission: RE | Admit: 2021-07-17 | Discharge: 2021-07-17 | Disposition: A | Discharge status: Home / Self Care | Payer: Medicare Other | Source: Ambulatory Visit | Attending: General Surgery | Admitting: General Surgery

## 2021-07-17 DIAGNOSIS — N6011 Diffuse cystic mastopathy of right breast: Secondary | ICD-10-CM | POA: Diagnosis not present

## 2021-07-17 DIAGNOSIS — N6341 Unspecified lump in right breast, subareolar: Secondary | ICD-10-CM | POA: Diagnosis not present

## 2021-07-17 DIAGNOSIS — R928 Other abnormal and inconclusive findings on diagnostic imaging of breast: Secondary | ICD-10-CM

## 2021-07-17 HISTORY — PX: BREAST BIOPSY: SHX20

## 2021-07-17 IMAGING — MR MR BREAST BX W/ LOC DEV 1ST LEASION IMAGE BX SPEC MR GUIDE*R*
6 of 8 series · 32 of 48 positions shown · IV contrast (gadavist)
Comparison: Previous exams.
COMPARISON: Previous exams.

Addendum:
CLINICAL DATA: 65-year-old female presents for MRI guided biopsy of
an enhancing mass in the retroareolar right breast middle depth.

EXAM:
MRI GUIDED CORE NEEDLE BIOPSY OF THE RIGHT BREAST
TECHNIQUE: Multiplanar, multisequence MR imaging of the right breast was
performed both before and after administration of intravenous
contrast.
CONTRAST:  7mL GADAVIST GADOBUTROL 1 MMOL/ML IV SOLN

[Series 3: fiducial unilateral · sagittal · 2.0mm · 1.33mm/px · 1 of 56 slices shown]
[im 1/56]
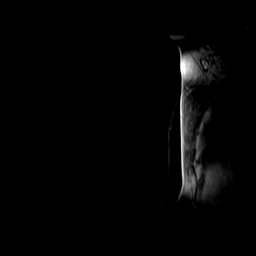

[Series 4: dynamic pre · axial · non-contrast · 1.3mm · 0.73mm/px · z∈[-83,+124]mm · 6 of 160 slices shown]
[im 1/160]
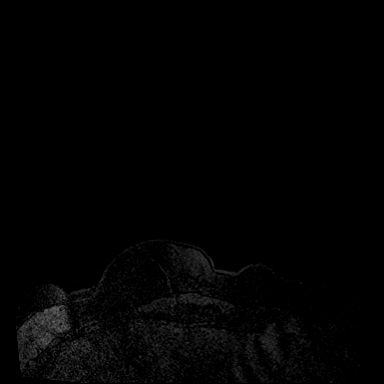
[im 32/160]
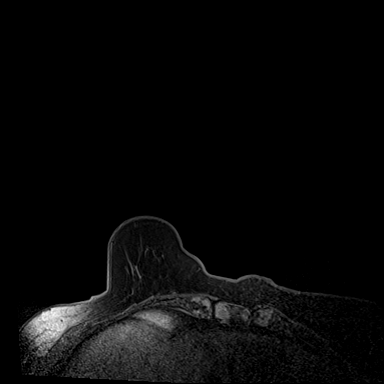
[im 64/160]
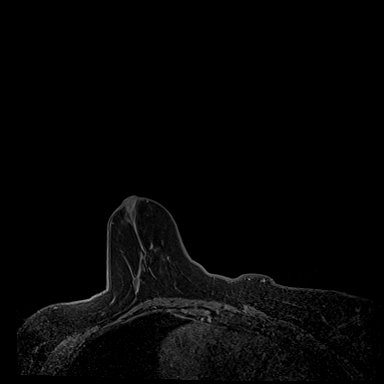
[im 96/160]
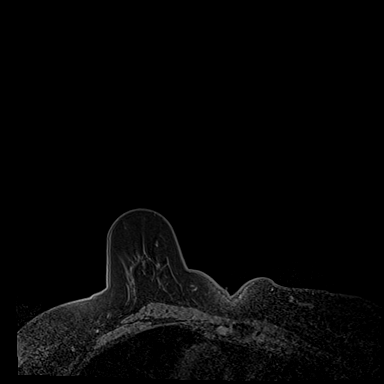
[im 128/160]
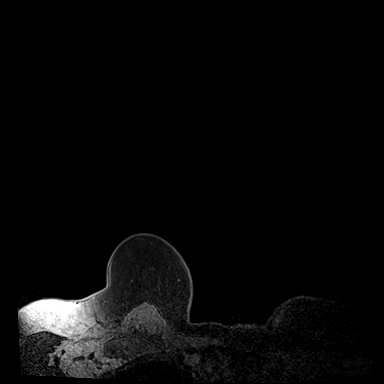
[im 160/160]
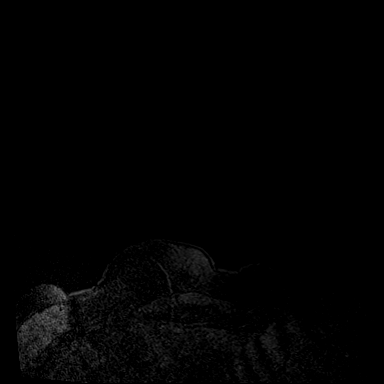

[Series 5: dynamic post 20 · axial · 1.3mm · 0.73mm/px · z∈[-83,+124]mm · 6 of 160 slices shown (1 of 2)]
[im 1/160]
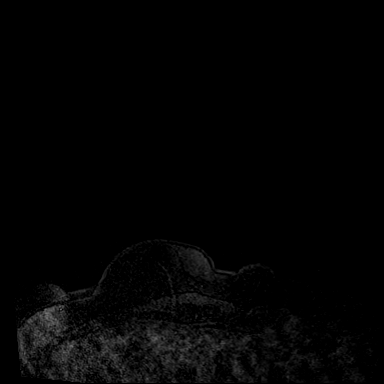
[im 32/160]
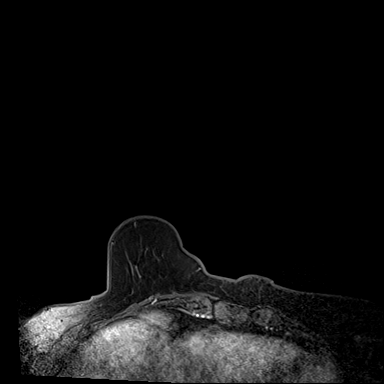
[im 64/160]
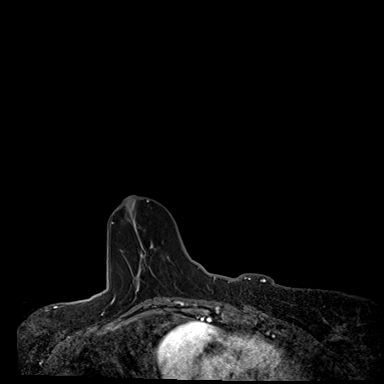
[im 96/160]
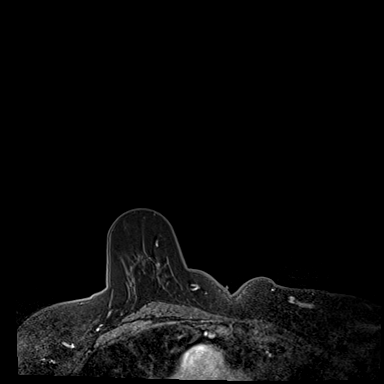
[im 128/160]
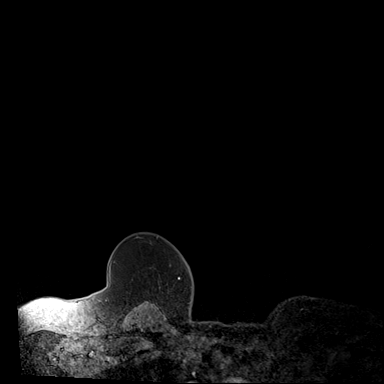
[im 160/160]
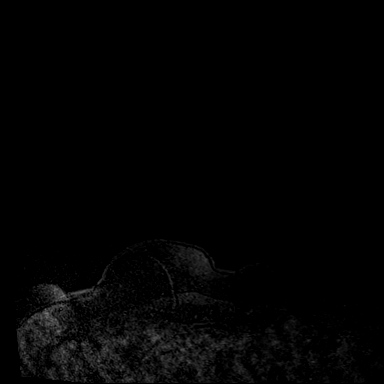

[Series 6: dynamic post 20 · axial · 1.3mm · 0.73mm/px · z∈[-83,+124]mm · 7 of 160 slices shown (2 of 2)]
[im 1/160]
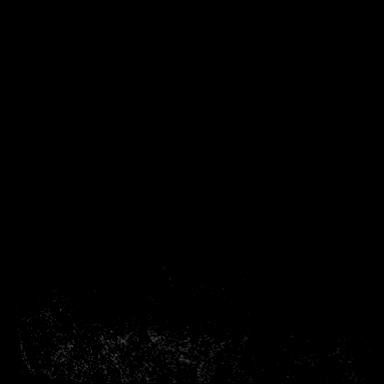
[im 27/160]
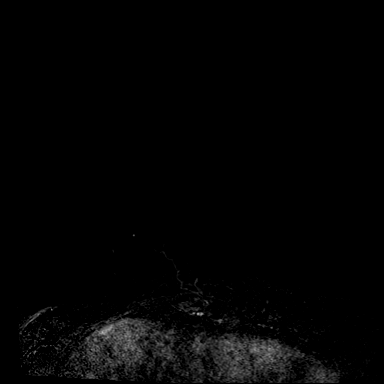
[im 54/160]
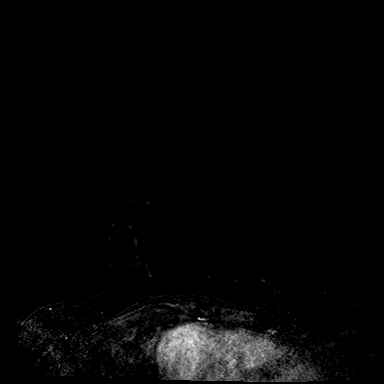
[im 80/160]
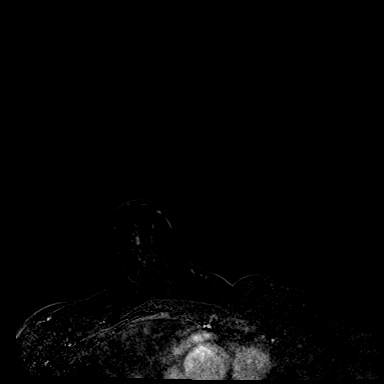
[im 107/160]
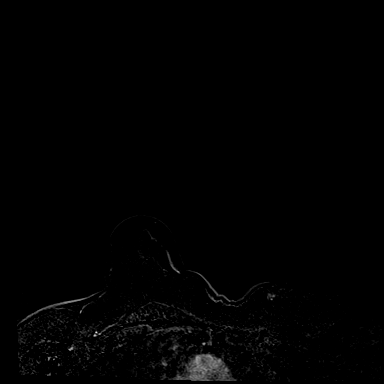
[im 133/160]
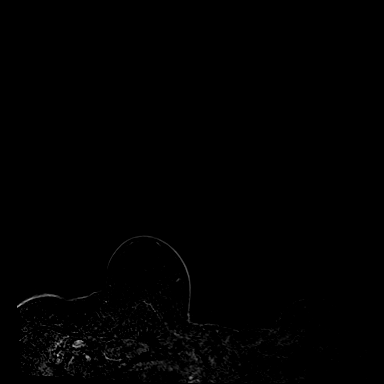
[im 160/160]
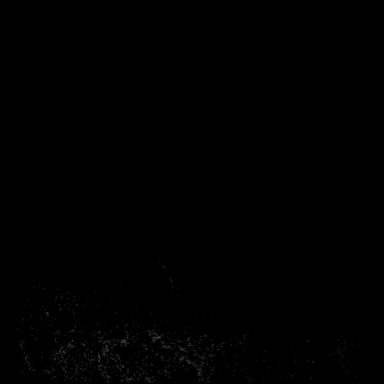

[Series 7: dynamic post 3 · axial · 1.3mm · 0.73mm/px · z∈[-83,+124]mm · 7 of 160 slices shown (1 of 2)]
[im 1/160]
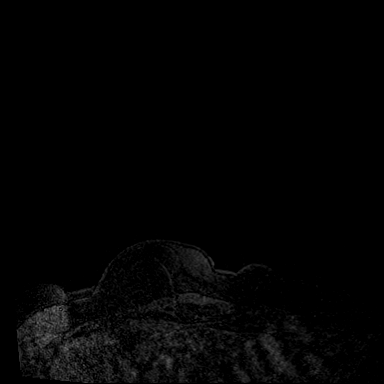
[im 27/160]
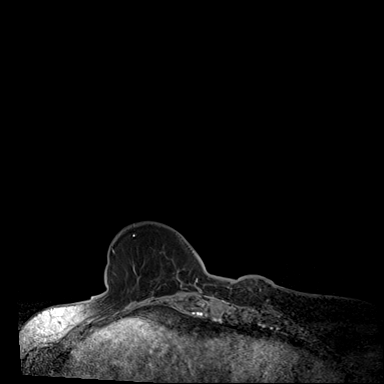
[im 54/160]
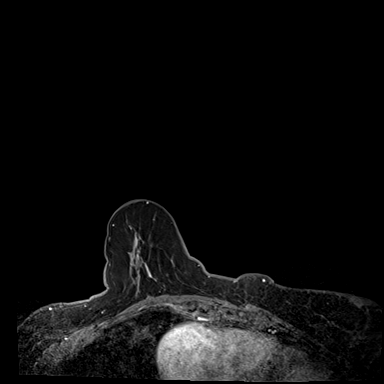
[im 80/160]
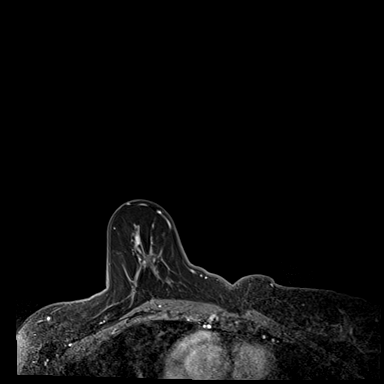
[im 107/160]
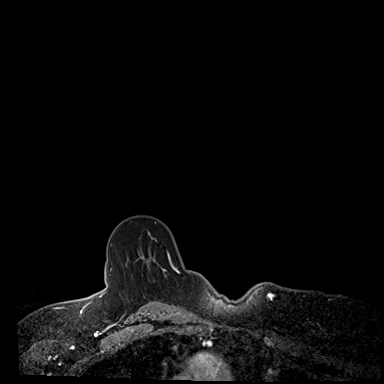
[im 133/160]
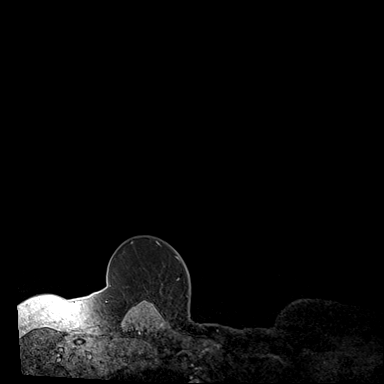
[im 160/160]
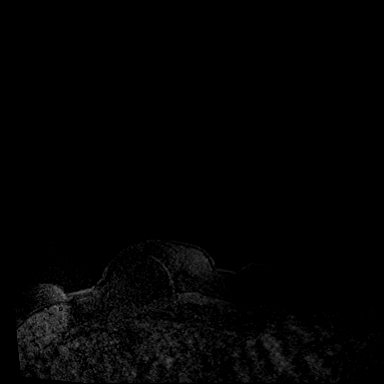

[Series 8: dynamic post 3 · axial · 1.3mm · 0.73mm/px · z∈[-83,+55]mm · 5 of 160 slices shown (2 of 2)]
[im 1/160]
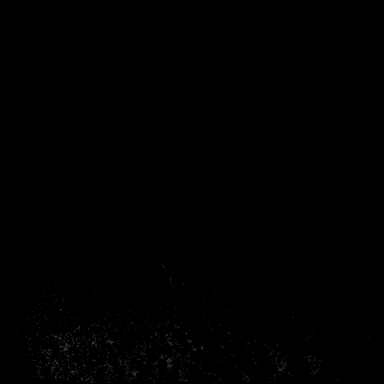
[im 27/160]
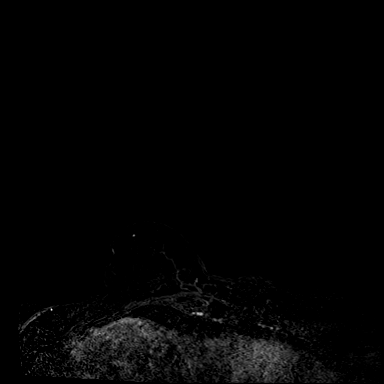
[im 54/160]
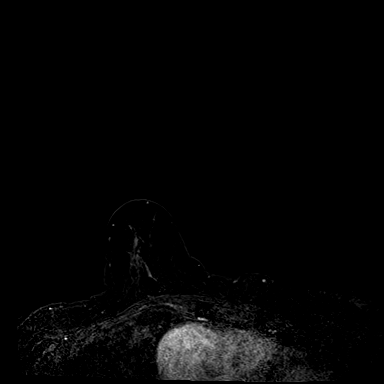
[im 80/160]
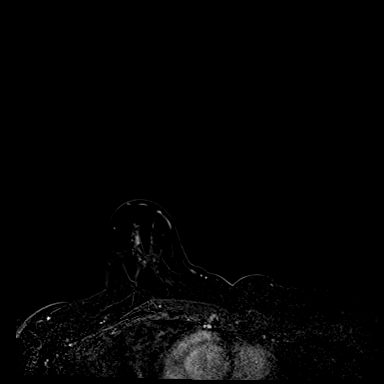
[im 107/160]
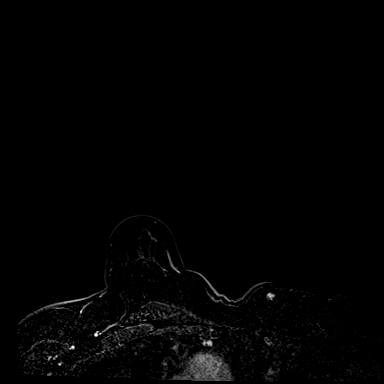

[32 of 48 positions shown; findings below may reference images not displayed]

FINDINGS: I met with the patient, and we discussed the procedure of MRI guided
biopsy, including risks, benefits, and alternatives. Specifically,
we discussed the risks of infection, bleeding, tissue injury, clip
migration, and inadequate sampling. Informed, written consent was
given. The usual time out protocol was performed immediately prior
to the procedure.

Using sterile technique, 1% Lidocaine, MRI guidance, and a 9 gauge
vacuum assisted device, biopsy was performed of the enhancing mass
in the retroareolar right breast middle depth using a lateral to
medial approach. At the conclusion of the procedure, a a cylindrical
shaped tissue marker clip was deployed into the biopsy cavity.
Follow-up 2-view mammogram was performed and dictated separately.
IMPRESSION: MRI guided biopsy of the enhancing mass in the retroareolar right
breast mid depth. No apparent complications.

ADDENDUM:
Pathology revealed USUAL DUCTAL AND COLUMNAR CELL HYPERPLASIA,
FIBROCYSTIC CHANGES WITH APOCRINE METAPLASIA- NEGATIVE FOR ATYPIA OR
MALIGNANCY of the RIGHT breast, retroareolar (cylindrical-shaped
clip). This was found to be concordant by Dr. BASTIEN.

Pathology results were discussed with the patient by telephone. The
patient reported doing well after the biopsy with tenderness at the
site. Post biopsy instructions and care were reviewed and questions
were answered. The patient was encouraged to call The [REDACTED]

The patient has a recent diagnosis of left breast cancer and should
follow her outlined treatment plan.

Breast MRI recommended in 6 months per protocol.

Pathology results reported by BASTIEN RN on [DATE].

*** End of Addendum ***
FINDINGS: I met with the patient, and we discussed the procedure of MRI guided
biopsy, including risks, benefits, and alternatives. Specifically,
we discussed the risks of infection, bleeding, tissue injury, clip
migration, and inadequate sampling. Informed, written consent was
given. The usual time out protocol was performed immediately prior
to the procedure.

Using sterile technique, 1% Lidocaine, MRI guidance, and a 9 gauge
vacuum assisted device, biopsy was performed of the enhancing mass
in the retroareolar right breast middle depth using a lateral to
medial approach. At the conclusion of the procedure, a a cylindrical
shaped tissue marker clip was deployed into the biopsy cavity.
Follow-up 2-view mammogram was performed and dictated separately.
IMPRESSION: MRI guided biopsy of the enhancing mass in the retroareolar right
breast mid depth. No apparent complications.

## 2021-07-17 IMAGING — MG MM BREAST LOCALIZATION CLIP
4 series · 4 of 12 positions shown · non-contrast
Comparison: Previous exam(s).

CLINICAL DATA: Post MRI guided biopsy of an enhancing mass in the
retroareolar right breast mid depth.

EXAM:
3D DIAGNOSTIC RIGHT MAMMOGRAM POST MRI BIOPSY

[R ML synth-2D]
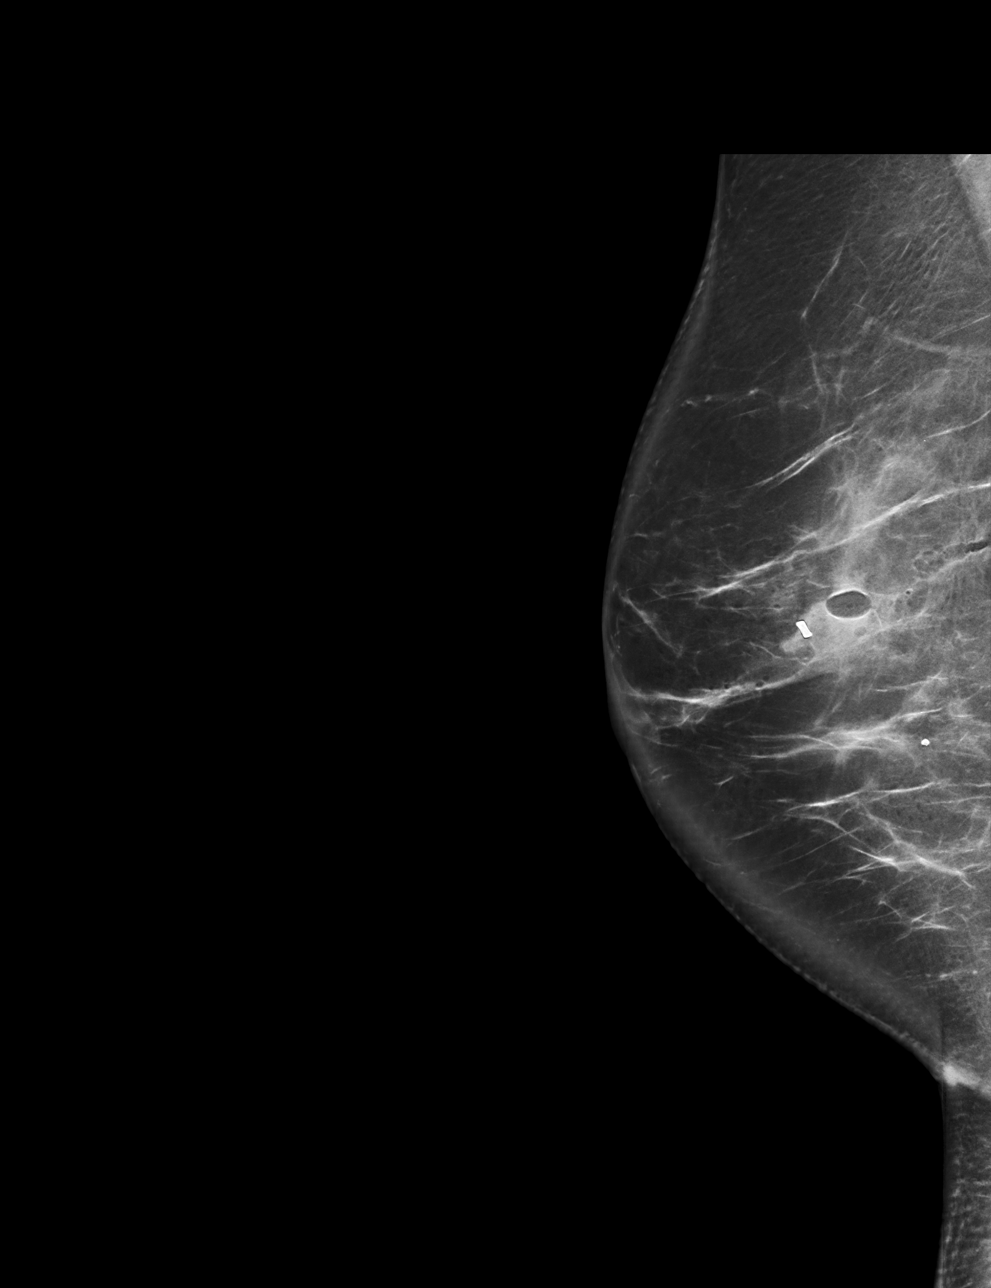

[R CC synth-2D]
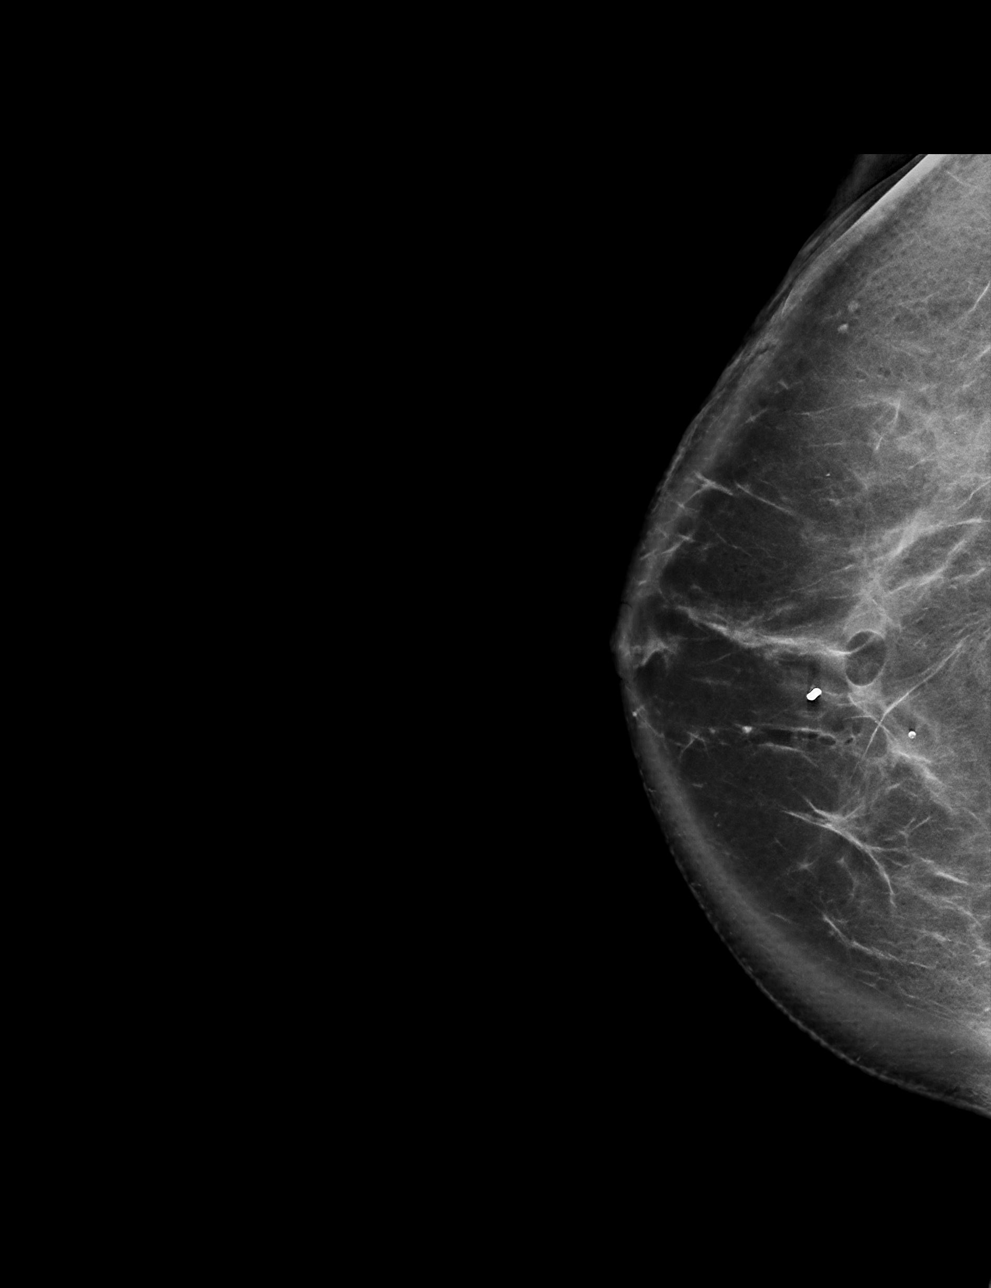

[R CC tomo · tomo slice 56/111.0]
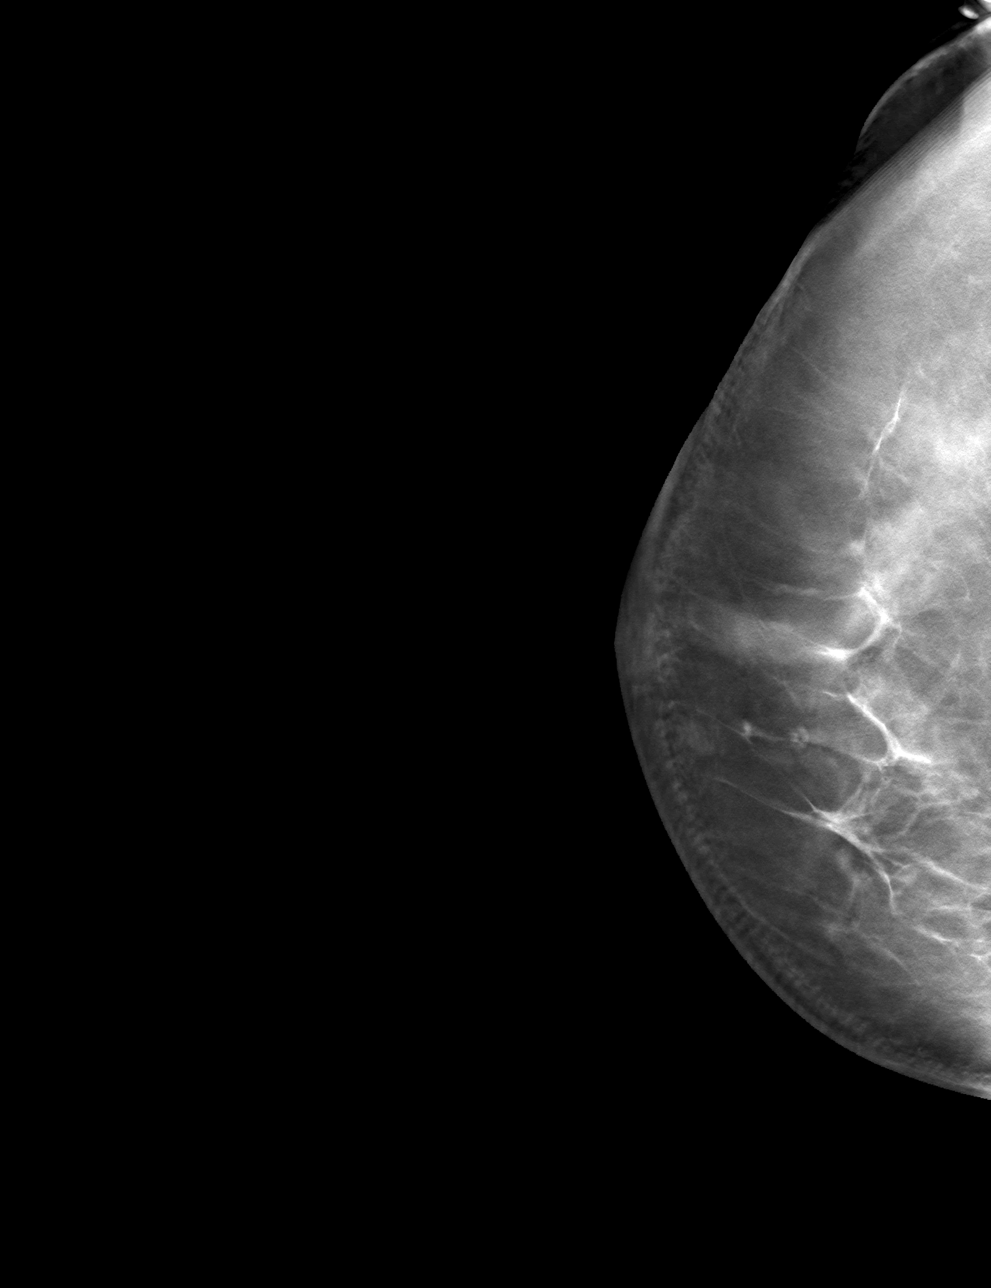

[R ML tomo · tomo slice 51/100.0]
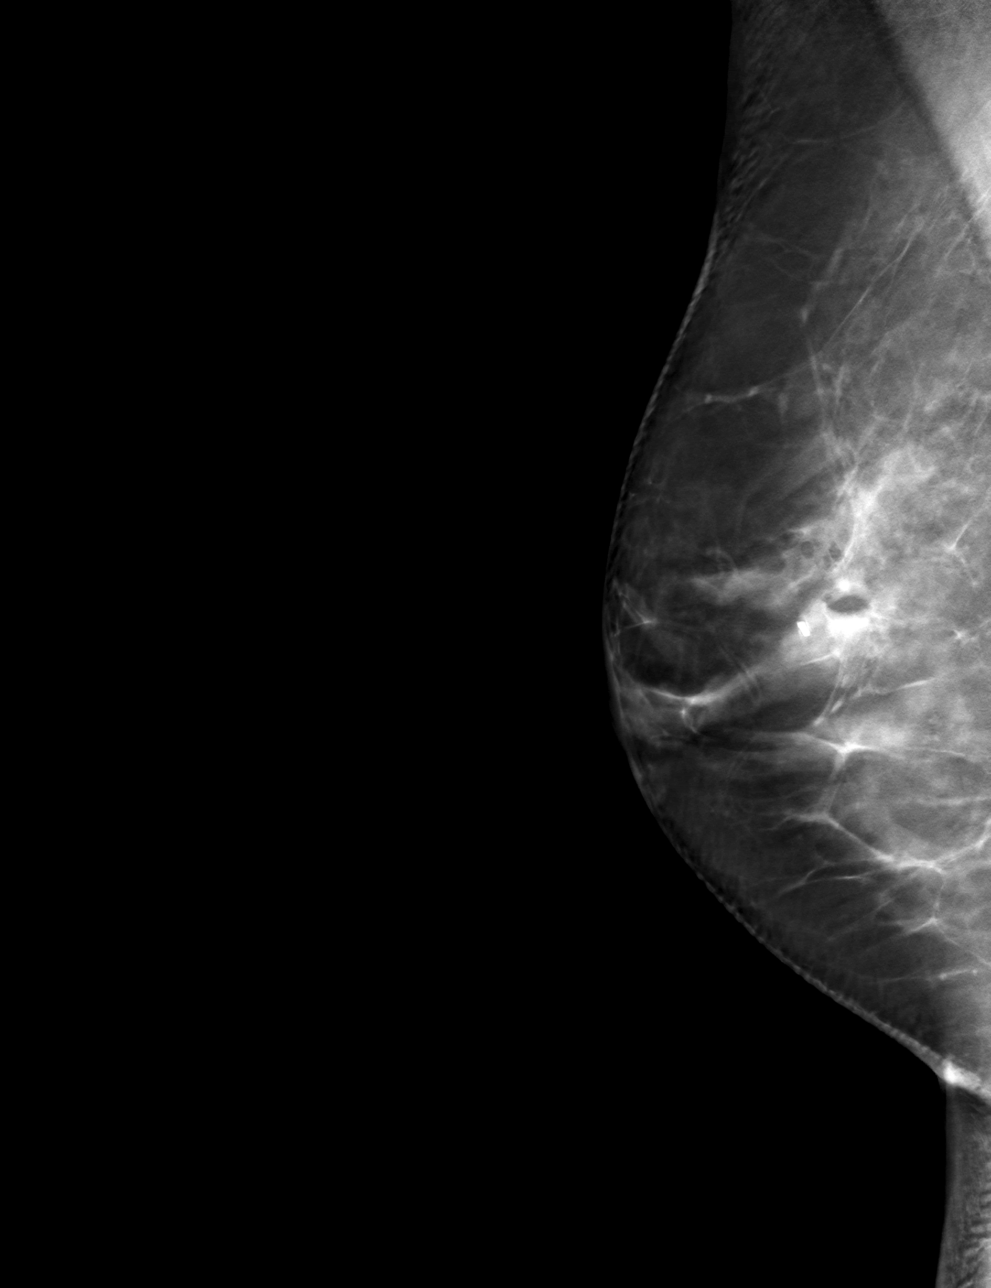

[4 of 12 positions shown; findings below may reference images not displayed]

FINDINGS: 3D Mammographic images were obtained following MRI guided biopsy of
an enhancing mass in the retroareolar right breast. A cylindrical
shaped biopsy marking clip is present and in the region of the
biopsied mass in the retroareolar right breast.
IMPRESSION: Cylindrical shaped biopsy marking clip in region of biopsied mass in
the retroareolar right breast mid depth.

Final Assessment: Post Procedure Mammograms for Marker Placement

## 2021-07-17 MED ORDER — GADOBUTROL 1 MMOL/ML IV SOLN
7.0000 mL | Freq: Once | INTRAVENOUS | Status: AC | PRN
  Administered 2021-07-17: 09:00:00 7 mL via INTRAVENOUS

## 2021-07-22 ENCOUNTER — Encounter: Payer: Self-pay | Admitting: *Deleted

## 2021-07-22 ENCOUNTER — Telehealth: Payer: Self-pay | Admitting: Genetic Counselor

## 2021-07-22 ENCOUNTER — Encounter: Payer: Self-pay | Admitting: Genetic Counselor

## 2021-07-22 DIAGNOSIS — Z1379 Encounter for other screening for genetic and chromosomal anomalies: Secondary | ICD-10-CM | POA: Insufficient documentation

## 2021-07-22 NOTE — Telephone Encounter (Signed)
I contacted Ms. Bradburn to discuss her genetic testing results. No pathogenic variants were identified in the first 8 genes analyzed. Of note, we are waiting for additional genes to be analyzed and will contact her when they are available. Detailed clinic note to follow.  The test report has been scanned into EPIC and is located under the Molecular Pathology section of the Results Review tab.  A portion of the result report is included below for reference.   Lucille Passy, MS, [facility] Genetic Counselor [email or web address] (P) [phone]

## 2021-07-24 ENCOUNTER — Telehealth: Payer: Self-pay | Admitting: Genetic Counselor

## 2021-07-24 ENCOUNTER — Ambulatory Visit: Payer: Self-pay | Admitting: Genetic Counselor

## 2021-07-24 ENCOUNTER — Encounter: Payer: Self-pay | Admitting: *Deleted

## 2021-07-24 DIAGNOSIS — Z1379 Encounter for other screening for genetic and chromosomal anomalies: Secondary | ICD-10-CM

## 2021-07-24 DIAGNOSIS — D0512 Intraductal carcinoma in situ of left breast: Secondary | ICD-10-CM

## 2021-07-24 NOTE — Telephone Encounter (Signed)
I contacted Ms. Hewlett to discuss her genetic testing results. No pathogenic variants were identified in the 77 genes analyzed. Detailed clinic note to follow.  The test report has been scanned into EPIC and is located under the Molecular Pathology section of the Results Review tab.  A portion of the result report is included below for reference.   Lucille Passy, MS, [facility] Genetic Counselor [email or web address] (P) [phone]

## 2021-07-24 NOTE — Progress Notes (Signed)
HPI:   Ms. Gentry was previously seen in the [place] clinic due to a personal and family history of cancer and concerns regarding a hereditary predisposition to cancer. Please refer to our prior cancer genetics clinic note for more information regarding our discussion, assessment and recommendations, at the time. Ms. Avena recent genetic test results were disclosed to her, as were recommendations warranted by these results. These results and recommendations are discussed in more detail below.  CANCER HISTORY:  Oncology History  Ductal carcinoma in situ (DCIS) of left breast  06/25/2021 Initial Diagnosis   Screening mammogram detected left breast abnormalities. biopsy on 06/25/2021 showed atypical epithelium 0.5 cm mass at 7:00, and DCIS low-grade 1.1 cm mass at 12:00, ER/PR+ (100%).     Genetic Testing   [facility] CancerNext-Expanded Panel+RNA is Negative. Report date is 07/24/2021.  The CancerNext-Expanded gene panel offered by [facility] and includes sequencing, rearrangement, and RNA analysis for the following 77 genes: AIP, ALK, APC, ATM, AXIN2, BAP1, BARD1, BLM, BMPR1A, BRCA1, BRCA2, BRIP1, CDC73, CDH1, CDK4, CDKN1B, CDKN2A, CHEK2, CTNNA1, DICER1, FANCC, FH, FLCN, GALNT12, KIF1B, LZTR1, MAX, MEN1, MET, MLH1, MSH2, MSH3, MSH6, MUTYH, NBN, NF1, NF2, NTHL1, PALB2, PHOX2B, PMS2, POT1, PRKAR1A, PTCH1, PTEN, RAD51C, RAD51D, RB1, RECQL, RET, SDHA, SDHAF2, SDHB, SDHC, SDHD, SMAD4, SMARCA4, SMARCB1, SMARCE1, STK11, SUFU, TMEM127, TP53, TSC1, TSC2, VHL and XRCC2 (sequencing and deletion/duplication); EGFR, EGLN1, HOXB13, KIT, MITF, PDGFRA, POLD1, and POLE (sequencing only); EPCAM and GREM1 (deletion/duplication only).      FAMILY HISTORY:  We obtained a detailed, 4-generation family history.  Significant diagnoses are listed below:      Family History  Problem Relation Age of Onset   Hypertension Mother     High Cholesterol Mother     Breast cancer Mother 33   Stroke Father      Multiple myeloma Father 48   Diabetes Sister     Prostate cancer Brother          dx. mid 91s   Stroke Maternal Grandmother     Diabetes Paternal Grandmother     Melanoma Daughter 60      Ms. 68 daughter, Raquel Sarna, was diagnosed with melanoma at age 73. Ms. Cozza brother was diagnosed with prostate cancer in his mid 32s and her mother was diagnosed with breast cancer at 66. Her father was diagnosed with multiple myeloma at 21   Ms. Corso is unaware of previous family history of genetic testing for hereditary cancer risks. There is no reported Ashkenazi Jewish ancestry.  GENETIC TEST RESULTS:  The [facility] CancerNext-Expanded Panel found no pathogenic mutations.   The CancerNext-Expanded gene panel offered by [facility] and includes sequencing, rearrangement, and RNA analysis for the following 77 genes: AIP, ALK, APC, ATM, AXIN2, BAP1, BARD1, BLM, BMPR1A, BRCA1, BRCA2, BRIP1, CDC73, CDH1, CDK4, CDKN1B, CDKN2A, CHEK2, CTNNA1, DICER1, FANCC, FH, FLCN, GALNT12, KIF1B, LZTR1, MAX, MEN1, MET, MLH1, MSH2, MSH3, MSH6, MUTYH, NBN, NF1, NF2, NTHL1, PALB2, PHOX2B, PMS2, POT1, PRKAR1A, PTCH1, PTEN, RAD51C, RAD51D, RB1, RECQL, RET, SDHA, SDHAF2, SDHB, SDHC, SDHD, SMAD4, SMARCA4, SMARCB1, SMARCE1, STK11, SUFU, TMEM127, TP53, TSC1, TSC2, VHL and XRCC2 (sequencing and deletion/duplication); EGFR, EGLN1, HOXB13, KIT, MITF, PDGFRA, POLD1, and POLE (sequencing only); EPCAM and GREM1 (deletion/duplication only).     The test report has been scanned into EPIC and is located under the Molecular Pathology section of the Results Review tab.  A portion of the result report is included below for reference. Genetic testing reported out on 07/24/2021.  Even though a pathogenic variant was not identified, possible explanations for the cancer in the family may include: There may be no hereditary risk for cancer in the family. The cancers in Ms. Messimer and/or her family may be due to other genetic or environmental  factors. There may be a gene mutation in one of these genes that current testing methods cannot detect, but that chance is small. There could be another gene that has not yet been discovered, or that we have not yet tested, that is responsible for the cancer diagnoses in the family.  It is also possible there is a hereditary cause for the cancer in the family that Ms. Fait did not inherit.  Therefore, it is important to remain in touch with cancer genetics in the future so that we can continue to offer Ms. Larouche the most up to date genetic testing.   ADDITIONAL GENETIC TESTING:  We discussed with Ms. Forrer that her genetic testing was fairly extensive.  If there are genes identified to increase cancer risk that can be analyzed in the future, we would be happy to discuss and coordinate this testing at that time.    CANCER SCREENING RECOMMENDATIONS:  Ms. Zellars test result is considered negative (normal).  This means that we have not identified a hereditary cause for her personal and family history of cancer at this time.   An individual's cancer risk and medical management are not determined by genetic test results alone. Overall cancer risk assessment incorporates additional factors, including personal medical history, family history, and any available genetic information that may result in a personalized plan for cancer prevention and surveillance. Therefore, it is recommended she continue to follow the cancer management and screening guidelines provided by her oncology and primary healthcare provider.  RECOMMENDATIONS FOR FAMILY MEMBERS:   Since she did not inherit a mutation in a cancer predisposition gene included on this panel, her children could not have inherited a mutation from her in one of these genes. Individuals in this family might be at some increased risk of developing cancer, over the general population risk, due to the family history of cancer. We recommend women in this family have a  yearly mammogram beginning at age 3, or 75 years younger than the earliest onset of cancer, an annual clinical breast exam, and perform monthly breast self-exams.  FOLLOW-UP:  Cancer genetics is a rapidly advancing field and it is possible that new genetic tests will be appropriate for her and/or her family members in the future. We encouraged her to remain in contact with cancer genetics on an annual basis so we can update her personal and family histories and let her know of advances in cancer genetics that may benefit this family.   Our contact number was provided. Ms. Wellman questions were answered to her satisfaction, and she knows she is welcome to call us at anytime with additional questions or concerns.   Lucille Passy, MS, [facility] Genetic Counselor [email or web address] (P) [phone]

## 2021-07-29 ENCOUNTER — Encounter: Payer: Self-pay | Admitting: *Deleted

## 2021-07-29 ENCOUNTER — Other Ambulatory Visit: Payer: Self-pay | Admitting: General Surgery

## 2021-07-29 DIAGNOSIS — D0512 Intraductal carcinoma in situ of left breast: Secondary | ICD-10-CM

## 2021-07-31 ENCOUNTER — Encounter: Payer: Self-pay | Admitting: *Deleted

## 2021-07-31 ENCOUNTER — Encounter (HOSPITAL_BASED_OUTPATIENT_CLINIC_OR_DEPARTMENT_OTHER): Payer: Self-pay | Admitting: General Surgery

## 2021-07-31 ENCOUNTER — Other Ambulatory Visit: Payer: Self-pay

## 2021-07-31 DIAGNOSIS — D0512 Intraductal carcinoma in situ of left breast: Secondary | ICD-10-CM

## 2021-08-01 ENCOUNTER — Telehealth: Payer: Self-pay | Admitting: Hematology and Oncology

## 2021-08-01 NOTE — Telephone Encounter (Signed)
Sch per 1/5 inbasket, pt aware °

## 2021-08-04 DIAGNOSIS — D0512 Intraductal carcinoma in situ of left breast: Secondary | ICD-10-CM | POA: Diagnosis not present

## 2021-08-04 DIAGNOSIS — N6324 Unspecified lump in the left breast, lower inner quadrant: Secondary | ICD-10-CM | POA: Diagnosis not present

## 2021-08-04 MED ORDER — ENSURE PRE-SURGERY PO LIQD: 296.0000 mL | Freq: Once | ORAL | Status: DC

## 2021-08-04 NOTE — Progress Notes (Signed)

## 2021-08-05 ENCOUNTER — Encounter (HOSPITAL_BASED_OUTPATIENT_CLINIC_OR_DEPARTMENT_OTHER): Payer: Self-pay | Admitting: General Surgery

## 2021-08-05 ENCOUNTER — Ambulatory Visit (HOSPITAL_BASED_OUTPATIENT_CLINIC_OR_DEPARTMENT_OTHER)
Admission: RE | Admit: 2021-08-05 | Discharge: 2021-08-05 | Disposition: A | Discharge status: Home / Self Care | Payer: Medicare Other | Attending: General Surgery | Admitting: General Surgery

## 2021-08-05 ENCOUNTER — Ambulatory Visit (HOSPITAL_BASED_OUTPATIENT_CLINIC_OR_DEPARTMENT_OTHER): Payer: Medicare Other | Admitting: Certified Registered"

## 2021-08-05 ENCOUNTER — Encounter (HOSPITAL_BASED_OUTPATIENT_CLINIC_OR_DEPARTMENT_OTHER)
Admission: RE | Disposition: A | Discharge status: Home / Self Care | Payer: Self-pay | Source: Home / Self Care | Attending: General Surgery

## 2021-08-05 ENCOUNTER — Other Ambulatory Visit: Payer: Self-pay

## 2021-08-05 DIAGNOSIS — D242 Benign neoplasm of left breast: Secondary | ICD-10-CM | POA: Diagnosis not present

## 2021-08-05 DIAGNOSIS — Z808 Family history of malignant neoplasm of other organs or systems: Secondary | ICD-10-CM | POA: Diagnosis not present

## 2021-08-05 DIAGNOSIS — L905 Scar conditions and fibrosis of skin: Secondary | ICD-10-CM | POA: Diagnosis not present

## 2021-08-05 DIAGNOSIS — Z170 Estrogen receptor positive status [ER+]: Secondary | ICD-10-CM | POA: Diagnosis not present

## 2021-08-05 DIAGNOSIS — C50912 Malignant neoplasm of unspecified site of left female breast: Secondary | ICD-10-CM | POA: Diagnosis not present

## 2021-08-05 DIAGNOSIS — Z8042 Family history of malignant neoplasm of prostate: Secondary | ICD-10-CM | POA: Insufficient documentation

## 2021-08-05 DIAGNOSIS — Z79899 Other long term (current) drug therapy: Secondary | ICD-10-CM | POA: Diagnosis not present

## 2021-08-05 DIAGNOSIS — D0512 Intraductal carcinoma in situ of left breast: Secondary | ICD-10-CM | POA: Diagnosis not present

## 2021-08-05 DIAGNOSIS — I10 Essential (primary) hypertension: Secondary | ICD-10-CM | POA: Diagnosis not present

## 2021-08-05 DIAGNOSIS — N6092 Unspecified benign mammary dysplasia of left breast: Secondary | ICD-10-CM | POA: Diagnosis not present

## 2021-08-05 HISTORY — PX: RADIOACTIVE SEED GUIDED EXCISIONAL BREAST BIOPSY: SHX6490

## 2021-08-05 HISTORY — PX: BREAST LUMPECTOMY WITH RADIOACTIVE SEED LOCALIZATION: SHX6424

## 2021-08-05 HISTORY — DX: Nausea with vomiting, unspecified: R11.2

## 2021-08-05 HISTORY — DX: Other specified postprocedural states: Z98.890

## 2021-08-05 SURGERY — BREAST LUMPECTOMY WITH RADIOACTIVE SEED LOCALIZATION
Anesthesia: General | Site: Breast | Laterality: Left

## 2021-08-05 MED ORDER — DROPERIDOL 2.5 MG/ML IJ SOLN
INTRAMUSCULAR | Status: DC | PRN
  Administered 2021-08-05: .625 mg via INTRAVENOUS

## 2021-08-05 MED ORDER — ACETAMINOPHEN 500 MG PO TABS
1000.0000 mg | ORAL_TABLET | ORAL | Status: AC
  Administered 2021-08-05: 1000 mg via ORAL

## 2021-08-05 MED ORDER — LACTATED RINGERS IV SOLN
INTRAVENOUS | Status: DC | PRN
Start: 2021-08-05 — End: 2021-08-05

## 2021-08-05 MED ORDER — MIDAZOLAM HCL 2 MG/2ML IJ SOLN
INTRAMUSCULAR | Status: AC
  Filled 2021-08-05: qty 2

## 2021-08-05 MED ORDER — LACTATED RINGERS IV SOLN: INTRAVENOUS | Status: DC

## 2021-08-05 MED ORDER — EPHEDRINE SULFATE 50 MG/ML IJ SOLN
INTRAMUSCULAR | Status: DC | PRN
  Administered 2021-08-05: 10 mg via INTRAVENOUS
  Administered 2021-08-05: 15 mg via INTRAVENOUS
  Administered 2021-08-05: 10 mg via INTRAVENOUS
  Administered 2021-08-05: 5 mg via INTRAVENOUS

## 2021-08-05 MED ORDER — TRAMADOL HCL 50 MG PO TABS: 100.0000 mg | ORAL_TABLET | Freq: Four times a day (QID) | ORAL | 0 refills | Status: DC | PRN

## 2021-08-05 MED ORDER — PROPOFOL 10 MG/ML IV BOLUS
INTRAVENOUS | Status: AC
  Filled 2021-08-05: qty 20

## 2021-08-05 MED ORDER — HYDROMORPHONE HCL 1 MG/ML IJ SOLN: 0.2500 mg | INTRAMUSCULAR | Status: DC | PRN

## 2021-08-05 MED ORDER — DEXAMETHASONE SODIUM PHOSPHATE 10 MG/ML IJ SOLN
INTRAMUSCULAR | Status: DC | PRN
  Administered 2021-08-05: 5 mg via INTRAVENOUS

## 2021-08-05 MED ORDER — CEFAZOLIN SODIUM-DEXTROSE 2-4 GM/100ML-% IV SOLN
INTRAVENOUS | Status: AC
  Filled 2021-08-05: qty 100

## 2021-08-05 MED ORDER — PROMETHAZINE HCL 25 MG/ML IJ SOLN: 6.2500 mg | INTRAMUSCULAR | Status: DC | PRN

## 2021-08-05 MED ORDER — OXYCODONE HCL 5 MG/5ML PO SOLN: 5.0000 mg | Freq: Once | ORAL | Status: DC | PRN

## 2021-08-05 MED ORDER — CHLORHEXIDINE GLUCONATE CLOTH 2 % EX PADS: 6.0000 | MEDICATED_PAD | Freq: Once | CUTANEOUS | Status: DC

## 2021-08-05 MED ORDER — ONDANSETRON HCL 4 MG/2ML IJ SOLN
INTRAMUSCULAR | Status: AC
  Filled 2021-08-05: qty 2

## 2021-08-05 MED ORDER — FENTANYL CITRATE (PF) 100 MCG/2ML IJ SOLN
INTRAMUSCULAR | Status: DC | PRN
  Administered 2021-08-05: 100 ug via INTRAVENOUS

## 2021-08-05 MED ORDER — LIDOCAINE 2% (20 MG/ML) 5 ML SYRINGE
INTRAMUSCULAR | Status: AC
  Filled 2021-08-05: qty 5

## 2021-08-05 MED ORDER — ACETAMINOPHEN 500 MG PO TABS
ORAL_TABLET | ORAL | Status: AC
  Filled 2021-08-05: qty 2

## 2021-08-05 MED ORDER — LIDOCAINE HCL (CARDIAC) PF 100 MG/5ML IV SOSY
PREFILLED_SYRINGE | INTRAVENOUS | Status: DC | PRN
  Administered 2021-08-05: 60 mg via INTRAVENOUS

## 2021-08-05 MED ORDER — MIDAZOLAM HCL 2 MG/2ML IJ SOLN
INTRAMUSCULAR | Status: DC | PRN
  Administered 2021-08-05: 2 mg via INTRAVENOUS

## 2021-08-05 MED ORDER — MEPERIDINE HCL 25 MG/ML IJ SOLN: 6.2500 mg | INTRAMUSCULAR | Status: DC | PRN

## 2021-08-05 MED ORDER — BUPIVACAINE HCL (PF) 0.25 % IJ SOLN
INTRAMUSCULAR | Status: DC | PRN
  Administered 2021-08-05: 10 mL

## 2021-08-05 MED ORDER — OXYCODONE HCL 5 MG PO TABS: 5.0000 mg | ORAL_TABLET | Freq: Once | ORAL | Status: DC | PRN

## 2021-08-05 MED ORDER — ONDANSETRON HCL 4 MG/2ML IJ SOLN
INTRAMUSCULAR | Status: DC | PRN
  Administered 2021-08-05: 4 mg via INTRAVENOUS

## 2021-08-05 MED ORDER — PROPOFOL 10 MG/ML IV BOLUS
INTRAVENOUS | Status: DC | PRN
  Administered 2021-08-05: 200 mg via INTRAVENOUS

## 2021-08-05 MED ORDER — CEFAZOLIN SODIUM-DEXTROSE 2-3 GM-%(50ML) IV SOLR
INTRAVENOUS | Status: DC | PRN
  Administered 2021-08-05: 2 g via INTRAVENOUS

## 2021-08-05 MED ORDER — FENTANYL CITRATE (PF) 100 MCG/2ML IJ SOLN
INTRAMUSCULAR | Status: AC
  Filled 2021-08-05: qty 2

## 2021-08-05 MED ORDER — CEFAZOLIN SODIUM-DEXTROSE 2-4 GM/100ML-% IV SOLN: 2.0000 g | INTRAVENOUS | Status: DC

## 2021-08-05 SURGICAL SUPPLY — 43 items
ADH SKN CLS APL DERMABOND .7 (GAUZE/BANDAGES/DRESSINGS) ×1
APL PRP STRL LF DISP 70% ISPRP (MISCELLANEOUS) ×1
BINDER BREAST LRG (GAUZE/BANDAGES/DRESSINGS) IMPLANT
BINDER BREAST XLRG (GAUZE/BANDAGES/DRESSINGS) IMPLANT
BLADE SURG 15 STRL LF DISP TIS (BLADE) ×1 IMPLANT
BLADE SURG 15 STRL SS (BLADE) ×2
CHLORAPREP W/TINT 26 (MISCELLANEOUS) ×2 IMPLANT
CLIP TI WIDE RED SMALL 6 (CLIP) ×1 IMPLANT
COVER BACK TABLE 60X90IN (DRAPES) ×2 IMPLANT
COVER MAYO STAND STRL (DRAPES) ×2 IMPLANT
COVER PROBE W GEL 5X96 (DRAPES) ×2 IMPLANT
DERMABOND ADVANCED (GAUZE/BANDAGES/DRESSINGS) ×1
DERMABOND ADVANCED .7 DNX12 (GAUZE/BANDAGES/DRESSINGS) ×1 IMPLANT
DRAPE LAPAROSCOPIC ABDOMINAL (DRAPES) ×2 IMPLANT
DRAPE UTILITY XL STRL (DRAPES) ×2 IMPLANT
ELECT COATED BLADE 2.86 ST (ELECTRODE) ×2 IMPLANT
ELECT REM PT RETURN 9FT ADLT (ELECTROSURGICAL) ×2
ELECTRODE REM PT RTRN 9FT ADLT (ELECTROSURGICAL) ×1 IMPLANT
GLOVE SURG ENC MOIS LTX SZ7 (GLOVE) ×4 IMPLANT
GLOVE SURG POLYISO LF SZ6 (GLOVE) ×1 IMPLANT
GLOVE SURG UNDER POLY LF SZ6.5 (GLOVE) ×1 IMPLANT
GLOVE SURG UNDER POLY LF SZ7 (GLOVE) ×1 IMPLANT
GLOVE SURG UNDER POLY LF SZ7.5 (GLOVE) ×2 IMPLANT
GOWN STRL REUS W/ TWL LRG LVL3 (GOWN DISPOSABLE) ×2 IMPLANT
GOWN STRL REUS W/TWL LRG LVL3 (GOWN DISPOSABLE) ×6
KIT MARKER MARGIN INK (KITS) ×2 IMPLANT
NDL HYPO 25X1 1.5 SAFETY (NEEDLE) ×1 IMPLANT
NEEDLE HYPO 25X1 1.5 SAFETY (NEEDLE) ×2 IMPLANT
PACK BASIN DAY SURGERY FS (CUSTOM PROCEDURE TRAY) ×2 IMPLANT
PENCIL SMOKE EVACUATOR (MISCELLANEOUS) ×2 IMPLANT
SLEEVE SCD COMPRESS KNEE MED (STOCKING) ×2 IMPLANT
SPONGE T-LAP 4X18 ~~LOC~~+RFID (SPONGE) ×2 IMPLANT
STRIP CLOSURE SKIN 1/2X4 (GAUZE/BANDAGES/DRESSINGS) ×2 IMPLANT
SUT MNCRL AB 4-0 PS2 18 (SUTURE) ×2 IMPLANT
SUT MON AB 5-0 PS2 18 (SUTURE) ×1 IMPLANT
SUT SILK 2 0 SH (SUTURE) ×1 IMPLANT
SUT VIC AB 2-0 SH 27 (SUTURE) ×4
SUT VIC AB 2-0 SH 27XBRD (SUTURE) ×1 IMPLANT
SUT VIC AB 3-0 SH 27 (SUTURE) ×2
SUT VIC AB 3-0 SH 27X BRD (SUTURE) ×1 IMPLANT
SYR CONTROL 10ML LL (SYRINGE) ×2 IMPLANT
TOWEL GREEN STERILE FF (TOWEL DISPOSABLE) ×2 IMPLANT
TRAY FAXITRON CT DISP (TRAY / TRAY PROCEDURE) ×2 IMPLANT

## 2021-08-05 NOTE — Transfer of Care (Signed)
Immediate Anesthesia Transfer of Care Note  Patient: Whitney Ford  Procedure(s) Performed: LEFT BREAST LUMPECTOMY WITH RADIOACTIVE SEED LOCALIZATION (Left: Breast) RADIOACTIVE SEED GUIDED EXCISIONAL LEFT BREAST BIOPSY (Left: Breast)  Patient Location: PACU  Anesthesia Type:General  Level of Consciousness: awake, alert  and oriented  Airway & Oxygen Therapy: Patient Spontanous Breathing and Patient connected to face mask oxygen  Post-op Assessment: Report given to RN and Post -op Vital signs reviewed and stable  Post vital signs: Reviewed and stable  Last Vitals:  Vitals Value Taken Time  BP    Temp    Pulse    Resp    SpO2      Last Pain:  Vitals:   08/05/21 1004  TempSrc: Oral  PainSc: 0-No pain      Patients Stated Pain Goal: 3 (72/09/19 8022)  Complications: No notable events documented.

## 2021-08-05 NOTE — Discharge Instructions (Addendum)
[place] Office Phone Number [phone]  POST OP INSTRUCTIONS Take 400 mg of ibuprofen every 8 hours or 650 mg tylenol every 6 hours for next 72 hours then as needed. Use ice several times daily also. Always review your discharge instruction sheet given to you by the facility where your surgery was performed.   Next dose of Tylenol after 4pm as needed for pain  IF YOU HAVE DISABILITY OR FAMILY LEAVE FORMS, YOU MUST BRING THEM TO THE OFFICE FOR PROCESSING.  DO NOT GIVE THEM TO YOUR DOCTOR.  A prescription for pain medication may be given to you upon discharge.  Take your pain medication as prescribed, if needed.  If narcotic pain medicine is not needed, then you may take acetaminophen (Tylenol), naprosyn (Alleve) or ibuprofen (Advil) as needed. Take your usually prescribed medications unless otherwise directed If you need a refill on your pain medication, please contact your pharmacy.  They will contact our office to request authorization.  Prescriptions will not be filled after 5pm or on week-ends. You should eat very light the first 24 hours after surgery, such as soup, crackers, pudding, etc.  Resume your normal diet the day after surgery. Most patients will experience some swelling and bruising in the breast.  Ice packs and a good support bra will help.  Wear the breast binder provided or a sports bra for 72 hours day and night.  After that wear a sports bra during the day until you return to the office. Swelling and bruising can take several days to resolve.  It is common to experience some constipation if taking pain medication after surgery.  Increasing fluid intake and taking a stool softener will usually help or prevent this problem from occurring.  A mild laxative (Milk of Magnesia or Miralax) should be taken according to package directions if there are no bowel movements after 48 hours. Unless discharge instructions indicate otherwise, you may remove your bandages 48  hours after surgery and you may shower at that time.  You may have steri-strips (small skin tapes) in place directly over the incision.  These strips should be left on the skin for 7-10 days and will come off on their own.  If your surgeon used skin glue on the incision, you may shower in 24 hours.  The glue will flake off over the next 2-3 weeks.  Any sutures or staples will be removed at the office during your follow-up visit. ACTIVITIES:  You may resume regular daily activities (gradually increasing) beginning the next day.  Wearing a good support bra or sports bra minimizes pain and swelling.  You may have sexual intercourse when it is comfortable. You may drive when you no longer are taking prescription pain medication, you can comfortably wear a seatbelt, and you can safely maneuver your car and apply brakes. RETURN TO WORK:  ______________________________________________________________________________________ Whitney Ford should see your doctor in the office for a follow-up appointment approximately two weeks after your surgery.  Your doctors nurse will typically make your follow-up appointment when she calls you with your pathology report.  Expect your pathology report 3-4 business days after your surgery.  You may call to check if you do not hear from [place] after three days. OTHER INSTRUCTIONS: _______________________________________________________________________________________________ _____________________________________________________________________________________________________________________________________ _____________________________________________________________________________________________________________________________________ _____________________________________________________________________________________________________________________________________  WHEN TO CALL DR WAKEFIELD: Fever over 101.0 Nausea and/or vomiting. Extreme swelling or bruising. Continued bleeding from  incision. Increased pain, redness, or drainage from the incision.  The clinic staff is available to answer your questions during regular  business hours.  Please dont hesitate to call and ask to speak to one of the nurses for clinical concerns.  If you have a medical emergency, go to the nearest emergency room or call 911.  A surgeon from [facility] is always on call at the hospital.  For further questions, please visit [email or web address]

## 2021-08-05 NOTE — Anesthesia Preprocedure Evaluation (Signed)
Anesthesia Evaluation  Patient identified by MRN, date of birth, ID band Patient awake    Reviewed: Allergy & Precautions, NPO status , Patient's Chart, lab work & pertinent test results  History of Anesthesia Complications (+) PONV  Airway Mallampati: II  TM Distance: >3 FB Neck ROM: Full    Dental no notable dental hx.    Pulmonary neg pulmonary ROS,    Pulmonary exam normal breath sounds clear to auscultation       Cardiovascular hypertension, Pt. on medications negative cardio ROS Normal cardiovascular exam Rhythm:Regular Rate:Normal     Neuro/Psych negative neurological ROS  negative psych ROS   GI/Hepatic negative GI ROS, Neg liver ROS,   Endo/Other  negative endocrine ROS  Renal/GU negative Renal ROS  negative genitourinary   Musculoskeletal negative musculoskeletal ROS (+)   Abdominal   Peds negative pediatric ROS (+)  Hematology negative hematology ROS (+)   Anesthesia Other Findings   Reproductive/Obstetrics negative OB ROS                             Anesthesia Physical Anesthesia Plan  ASA: 2  Anesthesia Plan: General   Post-op Pain Management:    Induction: Intravenous  PONV Risk Score and Plan: 4 or greater and Ondansetron, Dexamethasone, Midazolam, Droperidol and Treatment may vary due to age or medical condition  Airway Management Planned: LMA  Additional Equipment:   Intra-op Plan:   Post-operative Plan: Extubation in OR  Informed Consent: I have reviewed the patients History and Physical, chart, labs and discussed the procedure including the risks, benefits and alternatives for the proposed anesthesia with the patient or authorized representative who has indicated his/her understanding and acceptance.     Dental advisory given  Plan Discussed with: CRNA  Anesthesia Plan Comments:         Anesthesia Quick Evaluation

## 2021-08-05 NOTE — Op Note (Signed)
Preoperative diagnosis: Left breast DCIS and left breast core biopsy of mass with atypia Postoperative diagnosis: Same as above Procedure: 1.  Left breast radioactive seed guided lumpectomy 2.  Left breast seed guided excisional biopsy 3.  Injection of mag trace for possible delayed sentinel lymph node identification Surgeon: Dr. Serita Grammes Anesthesia: General  Estimated blood loss: Minimal Complications: None Drains: None Specimens: 1.  Left superior breast tissue marked with paint containing seed and clip 2.  Additional left breast medial, superior and posterior margins marked short stitch superior, long stitch lateral, double stitch deep 3. Left breast seed guided excisional biopsy marked with paint containing seed and clip Sponge and count was correct completion Disposition recovery stable condition  Indications: 15 yof who has fh breast cancer mom 24, brother with prostate cancer and father with myeloma. She underwent mammogram that shows a focal asymmetry at both 12 and 7 oclock. On mammogram these measure 5 mm and 1 cm. On [place] there is a 5x5x4 mm mass at 7 oclock and a 1.1x0.6x0.5 cm mass at 12 oclock. She has no mass or dc. Axillary [place] is negative. The biopsy of the 7 oclock lesion was atypical epithelium in a possible papillary lesion, at 12 oclock the biopsy was low grade dcis that was 100% er/pr positive. Since then she has had an MRI that shows indeterminate mass 0.6 cm right retroareolar, biopsy proven dcis in the upper left breast and biopsy proven atypia in liq of left breast.  She had mr guided biopsy of the right breast lesion and this is [facility] and concordant. We discussed seed guided excisional biopsy and lumpectomy.   Procedure: After informed consent was obtained I first did the lymph node injection.  I prepped the area around the areola.  I then injected 2 cc of mag trace in the subareolar position without difficulty.  this was done in order to do delayed sentinel  node if needed. She then underwent a pectoral block.  She was given antibiotics.  SCDs were in place.  She was then placed under general anesthesia without complication in the operating room.  She was prepped and draped in the standard sterile surgical fashion.  Surgical timeout was then performed.  I identified both of the seeds.  I infiltrated Marcaine.  I then made a periareolar incision.  I did both of these through the same incision.  I went inferiorly and remove the inferior lesion that was atypia on core biopsy.  The seed came out of the hematoma and I remove this separately.  Mammogram confirmed removal of the clip.  This was painted for pathology.  I then went through the superior aspect of the incision and used the neoprobe to remove the seed and the surrounding tissue.  I passed this off the table and confirm removal of the clip and the seed and this 1.  This was marked with pain as well.  I looked at my 3D images and I thought I might be close to 3 margins and I remove these and marked them with suture.  The posterior margin is now the muscle.  I then closed the breast tissue with 2-0 Vicryl after obtaining hemostasis.  The skin was closed with 3-0 Vicryl and 5-0 Monocryl.  Glue and Steri-Strips were applied.  She tolerated this well was extubated and transferred to recovery stable.

## 2021-08-05 NOTE — Anesthesia Postprocedure Evaluation (Signed)
Anesthesia Post Note  Patient: Whitney Ford  Procedure(s) Performed: LEFT BREAST LUMPECTOMY WITH RADIOACTIVE SEED LOCALIZATION (Left: Breast) RADIOACTIVE SEED GUIDED EXCISIONAL LEFT BREAST BIOPSY (Left: Breast)     Patient location during evaluation: PACU Anesthesia Type: General Level of consciousness: awake and alert Pain management: pain level controlled Vital Signs Assessment: post-procedure vital signs reviewed and stable Respiratory status: spontaneous breathing, nonlabored ventilation and respiratory function stable Cardiovascular status: blood pressure returned to baseline and stable Postop Assessment: no apparent nausea or vomiting Anesthetic complications: no   No notable events documented.  Last Vitals:  Vitals:   08/05/21 1300 08/05/21 1322  BP: 131/70 133/73  Pulse: 67 (!) 59  Resp: 16 18  Temp:  (!) 36.4 C  SpO2: 98% 98%    Last Pain:  Vitals:   08/05/21 1322  TempSrc: Oral  PainSc: 2                  Lynda Rainwater

## 2021-08-05 NOTE — Anesthesia Procedure Notes (Signed)
Procedure Name: LMA Insertion Date/Time: 08/05/2021 11:47 AM Performed by: Verita Lamb, CRNA Pre-anesthesia Checklist: Patient identified, Emergency Drugs available, Suction available and Patient being monitored Patient Re-evaluated:Patient Re-evaluated prior to induction Oxygen Delivery Method: Circle system utilized Preoxygenation: Pre-oxygenation with 100% oxygen Induction Type: IV induction Ventilation: Mask ventilation without difficulty LMA: LMA inserted LMA Size: 4.0 Number of attempts: 1 Airway Equipment and Method: Bite block Placement Confirmation: positive ETCO2, CO2 detector and breath sounds checked- equal and bilateral Tube secured with: Tape Dental Injury: Teeth and Oropharynx as per pre-operative assessment

## 2021-08-05 NOTE — H&P (Signed)
4 yof who has fh breast cancer mom 21, brother with prostate cancer and father with myeloma. She has no prior breast history she has no mass or dc. She underwent mammogram that shows a focal asymmetry at both 12 and 7 oclock. On mammogram these measure 5 mm and 1 cm. On [place] there is a 5x5x4 mm mass at 7 oclock and a 1.1x0.6x0.5 cm mass at 12 oclock. She has no mass or dc. Ax [place] is negative. The biopsy of the 7 oclock lesion was atypical epithelium in a possible papillary lesion, at 12 oclock the biopsy was low grade dcis that was 100% er/pr positive. She is here with her husband to discuss options Since then she has had an MRI that shows indeterminate mass 0.6 cm right retroareolar, biopsy proven dcis in the upper left breast and biopsy proven atypia in liq of left breast.  She had mr guided biopsy of the right breast lesion and this is [facility] and [facility] and concordant.    Review of Systems: A complete review of systems was obtained from the patient. I have reviewed this information and discussed as appropriate with the patient. See HPI as well for other ROS.  Review of Systems  All other systems reviewed and are negative.   Medical History: Past Medical History:  Diagnosis Date   Hyperlipidemia   Hypertension   Patient Active Problem List  Diagnosis   Hyperlipidemia   Ductal carcinoma in situ (DCIS) of left breast   Past Surgical History:  Procedure Laterality Date   LAPAROSCOPIC TUBAL LIGATION    No Known Allergies  Current Outpatient Medications on File Prior to Visit  Medication Sig Dispense Refill   atorvastatin (LIPITOR) 10 MG tablet Take 1 tablet (10 mg total) by mouth once daily   calcium carbonate (TUMS E-X) 300 mg (750 mg) chewable tablet Take 1 tablet (750 mg of salt total) by mouth every 2 (two) hours as needed   cetirizine (ZYRTEC) 10 mg capsule Take 1 capsule (10 mg total) by mouth once daily   cholecalciferol (VITAMIN D3) 1000 unit tablet Take by mouth   multivitamin  tablet Take 1 tablet by mouth once daily   olmesartan (BENICAR) 20 MG tablet Take 1 tablet (20 mg total) by mouth once daily   omega-3 acid ethyl esters (LOVAZA) 1 gram capsule Take 2 capsules (2 g total) by mouth 2 (two) times daily   Family History  Problem Relation Age of Onset   High blood pressure (Hypertension) Mother   Hyperlipidemia (Elevated cholesterol) Mother   Coronary Artery Disease (Blocked arteries around heart) Mother   Breast cancer Mother   Skin cancer Father   Stroke Father   Hyperlipidemia (Elevated cholesterol) Father   High blood pressure (Hypertension) Father   Brain cancer Father   High blood pressure (Hypertension) Sister   Hyperlipidemia (Elevated cholesterol) Sister   Diabetes Sister   High blood pressure (Hypertension) Brother   Stroke Maternal Grandmother   Diabetes Paternal Grandmother    Social History   Tobacco Use  Smoking Status Never  Smokeless Tobacco Never    Social History   Socioeconomic History   Marital status: Unknown  Tobacco Use   Smoking status: Never   Smokeless tobacco: Never  Vaping Use   Vaping Use: Unknown  Substance and Sexual Activity   Alcohol use: Yes   Drug use: Never   Objective:  There were no vitals filed for this visit.  There is no height or weight on file to  calculate BMI.  Physical Exam Chest:  Breasts: Right: No inverted nipple, mass or nipple discharge.  Left: No inverted nipple, mass or nipple discharge.  Lymphadenopathy:  Upper Body:  Right upper body: No supraclavicular or axillary adenopathy.  Left upper body: No supraclavicular or axillary adenopathy.  Neurological:  Mental Status: She is alert.     Assessment and Plan:    Left seed guided lumpectomy and excisional biopsy, genetics, magtrace injection  We discussed the staging and pathophysiology of breast cancer. We discussed all of the different options for treatment for breast cancer including surgery, chemotherapy, radiation  therapy, Herceptin, and antiestrogen therapy. She does not need a sentinel node biopsy now. I did discuss injecting magtrace just in case she needs delayed sn. I think she at minimum needs excisional biopsy of the atypia and lumpectomy of dcis. I dont think comet option given need for excisional biopsy. I will get mri to see if this is one process prior to surgery We discussed the options for treatment of the breast cancer which included lumpectomy versus a mastectomy. We discussed the performance of the lumpectomy with radioactive seed placement. We discussed a 5-10% chance of a positive margin requiring reexcision in the operating room. We also discussed that she will likely need radiation therapy if she undergoes lumpectomy. We discussed mastectomy and the postoperative care for that as well. Mastectomy can be followed by reconstruction. We discussed that there is no difference in her survival whether she undergoes lumpectomy with radiation therapy or antiestrogen therapy versus a mastectomy. There is also no real difference between her recurrence in the breast. We discussed the risks of operation including bleeding, infection, possible reoperation. She understands her further therapy will be based on what her stages at the time of her operation.

## 2021-08-05 NOTE — Interval H&P Note (Signed)
History and Physical Interval Note:  08/05/2021 11:00 AM  Whitney Ford  has presented today for surgery, with the diagnosis of LEFT BREAST CANCER.  The various methods of treatment have been discussed with the patient and family. After consideration of risks, benefits and other options for treatment, the patient has consented to  Procedure(s): LEFT BREAST LUMPECTOMY WITH RADIOACTIVE SEED LOCALIZATION (Left) RADIOACTIVE SEED GUIDED EXCISIONAL LEFT BREAST BIOPSY (Left) as a surgical intervention.  The patient's history has been reviewed, patient examined, no change in status, stable for surgery.  I have reviewed the patient's chart and labs.  Questions were answered to the patient's satisfaction.     Rolm Bookbinder

## 2021-08-06 ENCOUNTER — Encounter (HOSPITAL_BASED_OUTPATIENT_CLINIC_OR_DEPARTMENT_OTHER): Payer: Self-pay | Admitting: General Surgery

## 2021-08-08 LAB — SURGICAL PATHOLOGY

## 2021-08-11 ENCOUNTER — Encounter: Payer: Self-pay | Admitting: *Deleted

## 2021-08-19 NOTE — Progress Notes (Signed)
Patient Care Team: Antony Contras, MD as PCP - General (Family Medicine) Sueanne Margarita, MD as PCP - Cardiology (Cardiology) Rockwell Germany, RN as Oncology Nurse Navigator Mauro Kaufmann, RN as Oncology Nurse Navigator Rolm Bookbinder, MD as Consulting Physician (General Surgery) Nicholas Lose, MD as Consulting Physician (Hematology and Oncology) Kyung Rudd, MD as Consulting Physician (Radiation Oncology)  DIAGNOSIS:    ICD-10-CM   1. Ductal carcinoma in situ (DCIS) of left breast  D05.12       SUMMARY OF ONCOLOGIC HISTORY: Oncology History  Ductal carcinoma in situ (DCIS) of left breast  06/25/2021 Initial Diagnosis   Screening mammogram detected left breast abnormalities. biopsy on 06/25/2021 showed atypical epithelium 0.5 cm mass at 7:00, and DCIS low-grade 1.1 cm mass at 12:00, ER/PR+ (100%).     Genetic Testing   [facility] CancerNext-Expanded Panel+RNA is Negative. Report date is 07/24/2021.  The CancerNext-Expanded gene panel offered by [facility] and includes sequencing, rearrangement, and RNA analysis for the following 77 genes: AIP, ALK, APC, ATM, AXIN2, BAP1, BARD1, BLM, BMPR1A, BRCA1, BRCA2, BRIP1, CDC73, CDH1, CDK4, CDKN1B, CDKN2A, CHEK2, CTNNA1, DICER1, FANCC, FH, FLCN, GALNT12, KIF1B, LZTR1, MAX, MEN1, MET, MLH1, MSH2, MSH3, MSH6, MUTYH, NBN, NF1, NF2, NTHL1, PALB2, PHOX2B, PMS2, POT1, PRKAR1A, PTCH1, PTEN, RAD51C, RAD51D, RB1, RECQL, RET, SDHA, SDHAF2, SDHB, SDHC, SDHD, SMAD4, SMARCA4, SMARCB1, SMARCE1, STK11, SUFU, TMEM127, TP53, TSC1, TSC2, VHL and XRCC2 (sequencing and deletion/duplication); EGFR, EGLN1, HOXB13, KIT, MITF, PDGFRA, POLD1, and POLE (sequencing only); EPCAM and GREM1 (deletion/duplication only).    08/05/2021 Surgery   Left inferior lumpectomy: 0.3 cm DCIS intermediate grade arising a complex sclerosing lesion extends to the inked medial margin Left superior lumpectomy: DCIS intermediate grade as arising in a complex sclerosing lesion, margins  negative Medial margin excision: Radial scar with [facility], incidental papilloma Previous ER/PR 100% positive     CHIEF COMPLIANT: Follow-up of left breast DCIS  INTERVAL HISTORY: Whitney Ford is a 66 y.o. with above-mentioned history of left breast DCIS. Left inferior and superior lumpectomies on 08/05/2021 showed intermediate grade DCIS arising in a complex sclerosing lesion. She presents to the clinic today for follow-up.  She is healing and recovering very well from the recent surgery.  ALLERGIES:  has No Known Allergies.  MEDICATIONS:  Current Outpatient Medications  Medication Sig Dispense Refill   atorvastatin (LIPITOR) 20 MG tablet Take 1 tablet (20 mg total) by mouth daily. 30 tablet 6   B Complex Vitamins (B COMPLEX PO) Take by mouth.     Calcium Carbonate 500 MG CHEW at bedtime. 2 chews at bedtime nightly     cetirizine (ZYRTEC) 10 MG tablet Take 10 mg by mouth daily.     cholecalciferol (VITAMIN D) 25 MCG (1000 UNIT) tablet Take by mouth daily.     Multiple Vitamin (MULTIVITAMIN) tablet Take 1 tablet by mouth daily.     olmesartan (BENICAR) 20 MG tablet Take 20 mg by mouth daily.     Omega-3 Fatty Acids (FISH OIL) 1200 MG CAPS Take by mouth.     No current facility-administered medications for this visit.    PHYSICAL EXAMINATION: ECOG PERFORMANCE STATUS: 1 - Symptomatic but completely ambulatory  Vitals:   08/20/21 1110  BP: (!) 147/73  Pulse: (!) 56  Resp: 17  Temp: (!) 97.4 F (36.3 C)  SpO2: 100%   Filed Weights   08/20/21 1110  Weight: 162 lb 6.4 oz (73.7 kg)      LABORATORY DATA:  I have reviewed  the data as listed CMP Latest Ref Rng & Units 07/09/2021  Glucose 70 - 99 mg/dL 82  BUN 8 - 23 mg/dL 16  Creatinine 0.44 - 1.00 mg/dL 0.82  Sodium 135 - 145 mmol/L 144  Potassium 3.5 - 5.1 mmol/L 4.7  Chloride 98 - 111 mmol/L 106  CO2 22 - 32 mmol/L 30  Calcium 8.9 - 10.3 mg/dL 9.7  Total Protein 6.5 - 8.1 g/dL 6.9  Total Bilirubin 0.3 - 1.2 mg/dL 0.4   Alkaline Phos 38 - 126 U/L 89  AST 15 - 41 U/L 36  ALT 0 - 44 U/L 37    Lab Results  Component Value Date   WBC 6.1 07/09/2021   HGB 13.3 07/09/2021   HCT 40.3 07/09/2021   MCV 93.9 07/09/2021   PLT 219 07/09/2021   NEUTROABS 3.1 07/09/2021    ASSESSMENT & PLAN:  Ductal carcinoma in situ (DCIS) of left breast 06/25/2021:Screening mammogram detected left breast abnormalities. biopsy on 06/25/2021 showed atypical epithelium 0.5 cm mass at 7:00, and DCIS low-grade 1.1 cm mass at 12:00, ER/PR+ (100%).   08/05/21: Left Sup lumpectomy: IG DCIS, Left Inf lumpectomy: IG DCIS, ER/PR Pos  Treatment Plan: 1. adjuvant radiation therapy 2. Followed by antiestrogen therapy with tamoxifen 5 years  RTC after XRT is complete    No orders of the defined types were placed in this encounter.  The patient has a good understanding of the overall plan. she agrees with it. she will call with any problems that may develop before the next visit here.  Total time spent: 30 mins including face to face time and time spent for planning, charting and coordination of care  Rulon Eisenmenger, MD, MPH 08/20/2021  I, Thana Ates, am acting as scribe for Dr. Nicholas Lose.  I have reviewed the above documentation for accuracy and completeness, and I agree with the above.

## 2021-08-19 NOTE — Assessment & Plan Note (Signed)
06/25/2021:Screening mammogram detected left breast abnormalities. biopsy on 06/25/2021 showed atypical epithelium 0.5 cm mass at 7:00, and DCIS low-grade 1.1 cm mass at 12:00, ER/PR+ (100%).   08/05/21: Left Sup lumpectomy: IG DCIS, Left Inf lumpectomy: IG DCIS, ER/PR Pos  Treatment Plan: 1. adjuvant radiation therapy 2. Followed by antiestrogen therapy with tamoxifen 5 years  RTC after XRT is complete

## 2021-08-20 ENCOUNTER — Inpatient Hospital Stay: Payer: Medicare Other | Attending: Hematology and Oncology | Admitting: Hematology and Oncology

## 2021-08-20 ENCOUNTER — Other Ambulatory Visit: Payer: Self-pay

## 2021-08-20 DIAGNOSIS — D0512 Intraductal carcinoma in situ of left breast: Secondary | ICD-10-CM | POA: Diagnosis not present

## 2021-08-28 DIAGNOSIS — H547 Unspecified visual loss: Secondary | ICD-10-CM | POA: Diagnosis not present

## 2021-08-29 ENCOUNTER — Telehealth: Payer: Self-pay

## 2021-08-29 NOTE — Telephone Encounter (Signed)
Spoke w/ patient, verified identity and gave a friendly reminder of her 9:00am-09/02/21 in-person appointment w/ Shona Simpson PA-C. I advised patient to arrive at 8:45 am for check-in. I left my extension in case patient needs to call. Patient verbalized understanding of information given.

## 2021-09-01 ENCOUNTER — Telehealth: Payer: Self-pay

## 2021-09-01 NOTE — Telephone Encounter (Signed)
Spoke w/ patient, and verified identity. Reminded patient of her 9:00am-09/02/21 in-person appointment w/ Shona Simpson PA-C. I advised patient to arrive at 8:45am for check-in. I left my extension [phone] in case patient needs to call. Patient verbalized understanding of information.

## 2021-09-01 NOTE — Progress Notes (Signed)
Radiation Oncology         [phone] ________________________________  Name: Whitney Ford        MRN: [id number]  Date of Service: 09/02/2021 DOB: 1955/11/29  GP:[id number], Shanon Brow, MD  Nicholas Lose, MD     REFERRING PHYSICIAN: Nicholas Lose, MD   DIAGNOSIS: The encounter diagnosis was Ductal carcinoma in situ (DCIS) of left breast.   HISTORY OF PRESENT ILLNESS: Whitney Ford is a 66 y.o. female  originally seen in the multidisciplinary breast clinic for a new diagnosis of left breast cancer. The patient was noted to have focal asymmetry on screening mammogram in the left breast at 12:00 as well as a lesion in the 7 o'clock position.  She underwent diagnostic imaging, by ultrasound the 7:00 area measured 5 mm in the 12:00 lesion measured 1.1 cm.  Her left axilla was negative for adenopathy.  She underwent a biopsy of both sites on 06/25/2021.  Final pathology of the 7:00 lesion showed atypical epithelium within a complex sclerosing lesion and at 12:00 a papillary lesion containing low-grade DCIS that was ER/PR positive.    Since her last visit, she underwent Left lumpectomy on 08/05/2021 with Dr. Donne Hazel.  Final pathology showed intermediate grade DCIS arising in a complex sclerosing lesion in the inferior specimen this did extend to the inked medial margin, the left superior lumpectomy also showed an area measuring 3 mm of intermediate grade DCIS arising in a complex sclerosing lesion and her margins were negative but 2 mm to the posterior aspect.  Additional medial margin was negative and she is seen to discuss adjuvant radiotherapy.  PREVIOUS RADIATION THERAPY: No   PAST MEDICAL HISTORY:  Past Medical History:  Diagnosis Date   Bradycardia, sinus    Breast cancer (HCC)    Dry eyes    Elevated LDL cholesterol level 12/07/2019   Estrogen deficiency    Fibromuscular dysplasia (HCC)    Hives    Irregular heartbeat    PONV (postoperative nausea and vomiting)    Renovascular hypertension         PAST SURGICAL HISTORY: Past Surgical History:  Procedure Laterality Date   BREAST BIOPSY Right 07/17/2021   MRI Bx   BREAST LUMPECTOMY WITH RADIOACTIVE SEED LOCALIZATION Left 08/05/2021   Procedure: LEFT BREAST LUMPECTOMY WITH RADIOACTIVE SEED LOCALIZATION;  Surgeon: Rolm Bookbinder, MD;  Location: [facility];  Service: General;  Laterality: Left;   BTL     CHILDBIRTH     RADIOACTIVE SEED GUIDED EXCISIONAL BREAST BIOPSY Left 08/05/2021   Procedure: RADIOACTIVE SEED GUIDED EXCISIONAL LEFT BREAST BIOPSY;  Surgeon: Rolm Bookbinder, MD;  Location: [facility];  Service: General;  Laterality: Left;     FAMILY HISTORY:  Family History  Problem Relation Age of Onset   Hypertension Mother    High Cholesterol Mother    Breast cancer Mother 21   Stroke Father    Multiple myeloma Father 75   Diabetes Sister    Prostate cancer Brother        dx. mid 28s   Stroke Maternal Grandmother    Diabetes Paternal Grandmother    Melanoma Daughter 71     SOCIAL HISTORY:  reports that she has never smoked. She has never used smokeless tobacco. She reports current alcohol use of about 2.0 standard drinks per week. She reports that she does not use drugs.  The patient is married and lives in [place].  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 She enjoys exercising regularly.    ALLERGIES: Patient has no known allergies.   MEDICATIONS:  Current Outpatient Medications  Medication Sig Dispense Refill   atorvastatin (LIPITOR) 20 MG tablet Take 1 tablet (20 mg total) by mouth daily. 30 tablet 6   B Complex Vitamins (B COMPLEX PO) Take by mouth.     Calcium Carbonate 500 MG CHEW at bedtime. 2 chews at bedtime nightly     cetirizine (ZYRTEC) 10 MG tablet Take 10 mg by mouth daily.     cholecalciferol (VITAMIN D) 25 MCG (1000 UNIT) tablet Take by mouth daily.     Multiple Vitamin (MULTIVITAMIN) tablet Take 1 tablet by mouth  daily.     olmesartan (BENICAR) 20 MG tablet Take 20 mg by mouth daily.     Omega-3 Fatty Acids (FISH OIL) 1200 MG CAPS Take by mouth.     No current facility-administered medications for this visit.     REVIEW OF SYSTEMS: On review of systems, the patient reports that she is doing well overall. She is back to running and walking regularly. She recently enjoyed a hiking trip. No other complaints are verbalized.      PHYSICAL EXAM:  Wt Readings from Last 3 Encounters:  08/20/21 162 lb 6.4 oz (73.7 kg)  08/05/21 162 lb 11.2 oz (73.8 kg)  07/09/21 164 lb 8 oz (74.6 kg)   Temp Readings from Last 3 Encounters:  08/20/21 (!) 97.4 F (36.3 C) (Temporal)  08/05/21 (!) 97.5 F (36.4 C) (Oral)  07/09/21 (!) 97.2 F (36.2 C) (Temporal)   BP Readings from Last 3 Encounters:  08/20/21 (!) 147/73  08/05/21 133/73  07/09/21 (!) 141/80   Pulse Readings from Last 3 Encounters:  08/20/21 (!) 56  08/05/21 (!) 59  07/09/21 (!) 51    In general this is a well appearing caucasian female in no acute distress. She's alert and oriented x4 and appropriate throughout the examination. Cardiopulmonary assessment is negative for acute distress and she exhibits normal effort.  Her left lumpectomy site is well-healed without erythema separation or drainage.     ECOG = 0  0 - Asymptomatic (Fully active, able to carry on all predisease activities without restriction)  1 - Symptomatic but completely ambulatory (Restricted in physically strenuous activity but ambulatory and able to carry out work of a light or sedentary nature. For example, light housework, office work)  2 - Symptomatic, <50% in bed during the day (Ambulatory and capable of all self care but unable to carry out any work activities. Up and about more than 50% of waking hours)  3 - Symptomatic, >50% in bed, but not bedbound (Capable of only limited self-care, confined to bed or chair 50% or more of waking hours)  4 - Bedbound  (Completely disabled. Cannot carry on any self-care. Totally confined to bed or chair)  5 - Death   Eustace Pen MM, Creech RH, Tormey DC, et al. [phone]). "Toxicity and response criteria of the [facility]". [place] Oncol. 5 (6): 649-55    LABORATORY DATA:  Lab Results  Component Value Date   WBC 6.1 07/09/2021   HGB 13.3 07/09/2021   HCT 40.3 07/09/2021   MCV 93.9 07/09/2021   PLT 219 07/09/2021   Lab Results  Component Value Date   NA 144 07/09/2021   K 4.7 07/09/2021   CL 106 07/09/2021   CO2 30 07/09/2021   Lab Results  Component Value Date   ALT 37  07/09/2021   AST 36 07/09/2021   ALKPHOS 89 07/09/2021   BILITOT 0.4 07/09/2021      RADIOGRAPHY: No results found.     IMPRESSION/PLAN: 1. ER/PR positive, intermediate-grade DCIS of the left breast. Dr. Lisbeth Renshaw discusses the final pathology findings and reviews the nature of noninvasive left breast disease.  Dr. Lisbeth Renshaw discusses the rationale for external radiotherapy to the breast  to reduce risks of local recurrence followed by antiestrogen therapy. We discussed the risks, benefits, short, and long term effects of radiotherapy, as well as the curative intent, and the patient is interested in proceeding. Dr. Lisbeth Renshaw discusses the delivery and logistics of radiotherapy and anticipates a course of 4 weeks of radiotherapy to the left breast with deep inspiration breath-hold technique. Written consent is obtained and placed in the chart, a copy was provided to the patient.  She will simulate today.  In a visit lasting 45 minutes, greater than 50% of the time was spent face to face reviewing her case, as well as in preparation of, discussing, and coordinating the patient's care.      Carola Rhine, [facility]    **Disclaimer: This note was dictated with voice recognition software. Similar sounding words can inadvertently be transcribed and this note may contain transcription errors which may not have been  corrected upon publication of note.**

## 2021-09-02 ENCOUNTER — Other Ambulatory Visit: Payer: Self-pay

## 2021-09-02 ENCOUNTER — Ambulatory Visit
Admission: RE | Admit: 2021-09-02 | Discharge: 2021-09-02 | Disposition: A | Discharge status: Home / Self Care | Payer: Medicare Other | Source: Ambulatory Visit | Attending: Radiation Oncology | Admitting: Radiation Oncology

## 2021-09-02 ENCOUNTER — Encounter: Payer: Self-pay | Admitting: Radiation Oncology

## 2021-09-02 VITALS — BP 124/73 | HR 49 | Temp 96.6°F | Resp 18 | Ht 63.0 in | Wt 163.4 lb

## 2021-09-02 DIAGNOSIS — Z803 Family history of malignant neoplasm of breast: Secondary | ICD-10-CM | POA: Insufficient documentation

## 2021-09-02 DIAGNOSIS — Z8042 Family history of malignant neoplasm of prostate: Secondary | ICD-10-CM | POA: Insufficient documentation

## 2021-09-02 DIAGNOSIS — D0512 Intraductal carcinoma in situ of left breast: Secondary | ICD-10-CM | POA: Diagnosis not present

## 2021-09-02 DIAGNOSIS — I10 Essential (primary) hypertension: Secondary | ICD-10-CM | POA: Diagnosis not present

## 2021-09-02 DIAGNOSIS — Z510 Encounter for antineoplastic radiation therapy: Secondary | ICD-10-CM | POA: Diagnosis not present

## 2021-09-02 DIAGNOSIS — R001 Bradycardia, unspecified: Secondary | ICD-10-CM | POA: Diagnosis not present

## 2021-09-02 DIAGNOSIS — Z170 Estrogen receptor positive status [ER+]: Secondary | ICD-10-CM | POA: Insufficient documentation

## 2021-09-02 DIAGNOSIS — Z79899 Other long term (current) drug therapy: Secondary | ICD-10-CM | POA: Diagnosis not present

## 2021-09-02 NOTE — Progress Notes (Signed)
Spoke w/ patient, verified identity, and begin nursing interview. Patient reports unrelated, occasional dorsalgia, but otherwise is doing well. No other symptoms to report at this time.  Meaningful use complete. Postmenopausal & tubal ligation, therefore no chances of pregnancy.  BP 124/73 (BP Location: Left Arm, Patient Position: Sitting, Cuff Size: Normal)    Pulse (!) 49    Temp (!) 96.6 F (35.9 C) (Temporal)    Resp 18    Ht 5\' 3"  (1.6 m)    Wt 163 lb 6 oz (74.1 kg)    LMP  (LMP Unknown)    SpO2 100%    BMI 28.94 kg/m

## 2021-09-08 ENCOUNTER — Encounter: Payer: Self-pay | Admitting: *Deleted

## 2021-09-09 ENCOUNTER — Telehealth: Payer: Self-pay | Admitting: Hematology and Oncology

## 2021-09-09 NOTE — Telephone Encounter (Signed)
Called patient to schedule appointment per 2/13 inbasket , patient is aware of date and time.

## 2021-09-10 DIAGNOSIS — Z510 Encounter for antineoplastic radiation therapy: Secondary | ICD-10-CM | POA: Diagnosis not present

## 2021-09-10 DIAGNOSIS — D0512 Intraductal carcinoma in situ of left breast: Secondary | ICD-10-CM | POA: Diagnosis not present

## 2021-09-11 ENCOUNTER — Ambulatory Visit
Admission: RE | Admit: 2021-09-11 | Discharge: 2021-09-11 | Disposition: A | Discharge status: Home / Self Care | Payer: Medicare Other | Source: Ambulatory Visit | Attending: Radiation Oncology | Admitting: Radiation Oncology

## 2021-09-11 ENCOUNTER — Other Ambulatory Visit: Payer: Self-pay

## 2021-09-11 DIAGNOSIS — Z510 Encounter for antineoplastic radiation therapy: Secondary | ICD-10-CM | POA: Diagnosis not present

## 2021-09-11 DIAGNOSIS — D0512 Intraductal carcinoma in situ of left breast: Secondary | ICD-10-CM | POA: Diagnosis not present

## 2021-09-11 MED ORDER — SONAFINE EX EMUL
1.0000 "application " | Freq: Once | CUTANEOUS | Status: AC
  Administered 2021-09-11: 1 via TOPICAL

## 2021-09-11 NOTE — Progress Notes (Signed)
Pt here for patient teaching.  Pt given Radiation and You booklet, skin care instructions, and Sonafine.  Reviewed areas of pertinence such as fatigue, hair loss, skin changes, breast tenderness, and breast swelling . Pt able to give teach back of to pat skin and use unscented/gentle soap,apply Sonafine bid, avoid applying anything to skin within 4 hours of treatment, avoid wearing an under wire bra, and to use an electric razor if they must shave. Pt verbalizes understanding of information given and will contact nursing with any questions or concerns.    Gloriajean Dell. Leonie Green, BSN

## 2021-09-12 ENCOUNTER — Ambulatory Visit
Admission: RE | Admit: 2021-09-12 | Discharge: 2021-09-12 | Disposition: A | Discharge status: Home / Self Care | Payer: Medicare Other | Source: Ambulatory Visit | Attending: Radiation Oncology | Admitting: Radiation Oncology

## 2021-09-12 DIAGNOSIS — D0512 Intraductal carcinoma in situ of left breast: Secondary | ICD-10-CM | POA: Diagnosis not present

## 2021-09-12 DIAGNOSIS — Z510 Encounter for antineoplastic radiation therapy: Secondary | ICD-10-CM | POA: Diagnosis not present

## 2021-09-15 ENCOUNTER — Other Ambulatory Visit: Payer: Self-pay

## 2021-09-15 ENCOUNTER — Ambulatory Visit
Admission: RE | Admit: 2021-09-15 | Discharge: 2021-09-15 | Disposition: A | Discharge status: Home / Self Care | Payer: Medicare Other | Source: Ambulatory Visit | Attending: Radiation Oncology | Admitting: Radiation Oncology

## 2021-09-15 DIAGNOSIS — Z510 Encounter for antineoplastic radiation therapy: Secondary | ICD-10-CM | POA: Diagnosis not present

## 2021-09-15 DIAGNOSIS — D0512 Intraductal carcinoma in situ of left breast: Secondary | ICD-10-CM | POA: Diagnosis not present

## 2021-09-16 ENCOUNTER — Ambulatory Visit
Admission: RE | Admit: 2021-09-16 | Discharge: 2021-09-16 | Disposition: A | Discharge status: Home / Self Care | Payer: Medicare Other | Source: Ambulatory Visit | Attending: Radiation Oncology | Admitting: Radiation Oncology

## 2021-09-16 DIAGNOSIS — Z510 Encounter for antineoplastic radiation therapy: Secondary | ICD-10-CM | POA: Diagnosis not present

## 2021-09-16 DIAGNOSIS — D0512 Intraductal carcinoma in situ of left breast: Secondary | ICD-10-CM | POA: Diagnosis not present

## 2021-09-17 ENCOUNTER — Ambulatory Visit
Admission: RE | Admit: 2021-09-17 | Discharge: 2021-09-17 | Disposition: A | Discharge status: Home / Self Care | Payer: Medicare Other | Source: Ambulatory Visit | Attending: Radiation Oncology | Admitting: Radiation Oncology

## 2021-09-17 ENCOUNTER — Other Ambulatory Visit: Payer: Self-pay

## 2021-09-17 DIAGNOSIS — D0512 Intraductal carcinoma in situ of left breast: Secondary | ICD-10-CM | POA: Diagnosis not present

## 2021-09-17 DIAGNOSIS — Z510 Encounter for antineoplastic radiation therapy: Secondary | ICD-10-CM | POA: Diagnosis not present

## 2021-09-18 ENCOUNTER — Ambulatory Visit
Admission: RE | Admit: 2021-09-18 | Discharge: 2021-09-18 | Disposition: A | Discharge status: Home / Self Care | Payer: Medicare Other | Source: Ambulatory Visit | Attending: Radiation Oncology | Admitting: Radiation Oncology

## 2021-09-18 DIAGNOSIS — D0512 Intraductal carcinoma in situ of left breast: Secondary | ICD-10-CM | POA: Diagnosis not present

## 2021-09-18 DIAGNOSIS — Z510 Encounter for antineoplastic radiation therapy: Secondary | ICD-10-CM | POA: Diagnosis not present

## 2021-09-19 ENCOUNTER — Ambulatory Visit
Admission: RE | Admit: 2021-09-19 | Discharge: 2021-09-19 | Disposition: A | Discharge status: Home / Self Care | Payer: Medicare Other | Source: Ambulatory Visit | Attending: Radiation Oncology | Admitting: Radiation Oncology

## 2021-09-19 ENCOUNTER — Other Ambulatory Visit: Payer: Self-pay

## 2021-09-19 DIAGNOSIS — Z510 Encounter for antineoplastic radiation therapy: Secondary | ICD-10-CM | POA: Diagnosis not present

## 2021-09-19 DIAGNOSIS — D0512 Intraductal carcinoma in situ of left breast: Secondary | ICD-10-CM | POA: Diagnosis not present

## 2021-09-22 ENCOUNTER — Other Ambulatory Visit: Payer: Self-pay

## 2021-09-22 ENCOUNTER — Ambulatory Visit
Admission: RE | Admit: 2021-09-22 | Discharge: 2021-09-22 | Disposition: A | Discharge status: Home / Self Care | Payer: Medicare Other | Source: Ambulatory Visit | Attending: Radiation Oncology | Admitting: Radiation Oncology

## 2021-09-22 DIAGNOSIS — Z510 Encounter for antineoplastic radiation therapy: Secondary | ICD-10-CM | POA: Diagnosis not present

## 2021-09-22 DIAGNOSIS — D0512 Intraductal carcinoma in situ of left breast: Secondary | ICD-10-CM | POA: Diagnosis not present

## 2021-09-23 ENCOUNTER — Ambulatory Visit
Admission: RE | Admit: 2021-09-23 | Discharge: 2021-09-23 | Disposition: A | Discharge status: Home / Self Care | Payer: Medicare Other | Source: Ambulatory Visit | Attending: Radiation Oncology | Admitting: Radiation Oncology

## 2021-09-23 DIAGNOSIS — Z510 Encounter for antineoplastic radiation therapy: Secondary | ICD-10-CM | POA: Diagnosis not present

## 2021-09-23 DIAGNOSIS — D0512 Intraductal carcinoma in situ of left breast: Secondary | ICD-10-CM | POA: Diagnosis not present

## 2021-09-24 ENCOUNTER — Ambulatory Visit
Admission: RE | Admit: 2021-09-24 | Discharge: 2021-09-24 | Disposition: A | Discharge status: Home / Self Care | Payer: Medicare Other | Source: Ambulatory Visit | Attending: Radiation Oncology | Admitting: Radiation Oncology

## 2021-09-24 ENCOUNTER — Other Ambulatory Visit: Payer: Self-pay

## 2021-09-24 DIAGNOSIS — D0512 Intraductal carcinoma in situ of left breast: Secondary | ICD-10-CM | POA: Diagnosis not present

## 2021-09-24 DIAGNOSIS — Z510 Encounter for antineoplastic radiation therapy: Secondary | ICD-10-CM | POA: Insufficient documentation

## 2021-09-25 ENCOUNTER — Ambulatory Visit
Admission: RE | Admit: 2021-09-25 | Discharge: 2021-09-25 | Disposition: A | Discharge status: Home / Self Care | Payer: Self-pay | Source: Ambulatory Visit | Attending: Radiation Oncology | Admitting: Radiation Oncology

## 2021-09-25 ENCOUNTER — Inpatient Hospital Stay
Admission: RE | Admit: 2021-09-25 | Discharge: 2021-09-25 | Disposition: A | Discharge status: Home / Self Care | Payer: Self-pay | Source: Ambulatory Visit | Attending: Radiation Oncology | Admitting: Radiation Oncology

## 2021-09-25 ENCOUNTER — Other Ambulatory Visit: Payer: Self-pay | Admitting: Radiation Oncology

## 2021-09-25 ENCOUNTER — Ambulatory Visit
Admission: RE | Admit: 2021-09-25 | Discharge: 2021-09-25 | Disposition: A | Discharge status: Home / Self Care | Payer: Medicare Other | Source: Ambulatory Visit | Attending: Radiation Oncology | Admitting: Radiation Oncology

## 2021-09-25 DIAGNOSIS — D0512 Intraductal carcinoma in situ of left breast: Secondary | ICD-10-CM

## 2021-09-25 DIAGNOSIS — Z510 Encounter for antineoplastic radiation therapy: Secondary | ICD-10-CM | POA: Diagnosis not present

## 2021-09-26 ENCOUNTER — Ambulatory Visit
Admission: RE | Admit: 2021-09-26 | Discharge: 2021-09-26 | Disposition: A | Discharge status: Home / Self Care | Payer: Medicare Other | Source: Ambulatory Visit | Attending: Radiation Oncology | Admitting: Radiation Oncology

## 2021-09-26 ENCOUNTER — Other Ambulatory Visit: Payer: Self-pay

## 2021-09-26 ENCOUNTER — Ambulatory Visit: Payer: Medicare Other | Admitting: Radiation Oncology

## 2021-09-26 DIAGNOSIS — Z510 Encounter for antineoplastic radiation therapy: Secondary | ICD-10-CM | POA: Diagnosis not present

## 2021-09-26 DIAGNOSIS — D0512 Intraductal carcinoma in situ of left breast: Secondary | ICD-10-CM | POA: Diagnosis not present

## 2021-09-29 ENCOUNTER — Other Ambulatory Visit: Payer: Self-pay

## 2021-09-29 ENCOUNTER — Ambulatory Visit
Admission: RE | Admit: 2021-09-29 | Discharge: 2021-09-29 | Disposition: A | Discharge status: Home / Self Care | Payer: Medicare Other | Source: Ambulatory Visit | Attending: Radiation Oncology | Admitting: Radiation Oncology

## 2021-09-29 DIAGNOSIS — Z510 Encounter for antineoplastic radiation therapy: Secondary | ICD-10-CM | POA: Diagnosis not present

## 2021-09-29 DIAGNOSIS — D0512 Intraductal carcinoma in situ of left breast: Secondary | ICD-10-CM | POA: Diagnosis not present

## 2021-09-30 ENCOUNTER — Ambulatory Visit
Admission: RE | Admit: 2021-09-30 | Discharge: 2021-09-30 | Disposition: A | Discharge status: Home / Self Care | Payer: Medicare Other | Source: Ambulatory Visit | Attending: Radiation Oncology | Admitting: Radiation Oncology

## 2021-09-30 DIAGNOSIS — Z510 Encounter for antineoplastic radiation therapy: Secondary | ICD-10-CM | POA: Diagnosis not present

## 2021-09-30 DIAGNOSIS — D0512 Intraductal carcinoma in situ of left breast: Secondary | ICD-10-CM | POA: Diagnosis not present

## 2021-10-01 ENCOUNTER — Other Ambulatory Visit: Payer: Self-pay

## 2021-10-01 ENCOUNTER — Encounter (HOSPITAL_COMMUNITY): Payer: Self-pay

## 2021-10-01 ENCOUNTER — Ambulatory Visit
Admission: RE | Admit: 2021-10-01 | Discharge: 2021-10-01 | Disposition: A | Discharge status: Home / Self Care | Payer: Medicare Other | Source: Ambulatory Visit | Attending: Radiation Oncology | Admitting: Radiation Oncology

## 2021-10-01 DIAGNOSIS — D0512 Intraductal carcinoma in situ of left breast: Secondary | ICD-10-CM | POA: Diagnosis not present

## 2021-10-01 DIAGNOSIS — Z510 Encounter for antineoplastic radiation therapy: Secondary | ICD-10-CM | POA: Diagnosis not present

## 2021-10-02 ENCOUNTER — Ambulatory Visit
Admission: RE | Admit: 2021-10-02 | Discharge: 2021-10-02 | Disposition: A | Discharge status: Home / Self Care | Payer: Medicare Other | Source: Ambulatory Visit | Attending: Radiation Oncology | Admitting: Radiation Oncology

## 2021-10-02 DIAGNOSIS — Z510 Encounter for antineoplastic radiation therapy: Secondary | ICD-10-CM | POA: Diagnosis not present

## 2021-10-02 DIAGNOSIS — D0512 Intraductal carcinoma in situ of left breast: Secondary | ICD-10-CM | POA: Diagnosis not present

## 2021-10-03 ENCOUNTER — Other Ambulatory Visit: Payer: Self-pay

## 2021-10-03 ENCOUNTER — Ambulatory Visit: Payer: Medicare Other

## 2021-10-03 ENCOUNTER — Ambulatory Visit
Admission: RE | Admit: 2021-10-03 | Discharge: 2021-10-03 | Disposition: A | Discharge status: Home / Self Care | Payer: Medicare Other | Source: Ambulatory Visit | Attending: Radiation Oncology | Admitting: Radiation Oncology

## 2021-10-03 DIAGNOSIS — D0512 Intraductal carcinoma in situ of left breast: Secondary | ICD-10-CM | POA: Diagnosis not present

## 2021-10-03 DIAGNOSIS — Z510 Encounter for antineoplastic radiation therapy: Secondary | ICD-10-CM | POA: Diagnosis not present

## 2021-10-06 ENCOUNTER — Other Ambulatory Visit: Payer: Self-pay

## 2021-10-06 ENCOUNTER — Ambulatory Visit
Admission: RE | Admit: 2021-10-06 | Discharge: 2021-10-06 | Disposition: A | Discharge status: Home / Self Care | Payer: Medicare Other | Source: Ambulatory Visit | Attending: Radiation Oncology | Admitting: Radiation Oncology

## 2021-10-06 DIAGNOSIS — D0512 Intraductal carcinoma in situ of left breast: Secondary | ICD-10-CM | POA: Diagnosis not present

## 2021-10-06 DIAGNOSIS — Z510 Encounter for antineoplastic radiation therapy: Secondary | ICD-10-CM | POA: Diagnosis not present

## 2021-10-07 ENCOUNTER — Ambulatory Visit
Admission: RE | Admit: 2021-10-07 | Discharge: 2021-10-07 | Disposition: A | Discharge status: Home / Self Care | Payer: Medicare Other | Source: Ambulatory Visit | Attending: Radiation Oncology | Admitting: Radiation Oncology

## 2021-10-07 ENCOUNTER — Encounter: Payer: Self-pay | Admitting: *Deleted

## 2021-10-07 DIAGNOSIS — D0512 Intraductal carcinoma in situ of left breast: Secondary | ICD-10-CM | POA: Diagnosis not present

## 2021-10-07 DIAGNOSIS — Z510 Encounter for antineoplastic radiation therapy: Secondary | ICD-10-CM | POA: Diagnosis not present

## 2021-10-07 NOTE — Progress Notes (Signed)
? ?Patient Care Team: ?Antony Contras, MD as PCP - General (Family Medicine) ?Sueanne Margarita, MD as PCP - Cardiology (Cardiology) ?Rockwell Germany, RN as Oncology Nurse Navigator ?Mauro Kaufmann, RN as Oncology Nurse Navigator ?Rolm Bookbinder, MD as Consulting Physician (General Surgery) ?Nicholas Lose, MD as Consulting Physician (Hematology and Oncology) ?Kyung Rudd, MD as Consulting Physician (Radiation Oncology) ? ?DIAGNOSIS:  ?Encounter Diagnosis  ?Name Primary?  ? Ductal carcinoma in situ (DCIS) of left breast   ? ? ?SUMMARY OF ONCOLOGIC HISTORY: ?Oncology History  ?Ductal carcinoma in situ (DCIS) of left breast  ?06/25/2021 Initial Diagnosis  ? Screening mammogram detected left breast abnormalities. biopsy on 06/25/2021 showed atypical epithelium 0.5 cm mass at 7:00, and DCIS low-grade 1.1 cm mass at 12:00, ER/PR+ (100%).  ?  ? Genetic Testing  ? [facility] CancerNext-Expanded Panel+RNA is Negative. Report date is 07/24/2021. ? ?The CancerNext-Expanded gene panel offered by [facility] and includes sequencing, rearrangement, and RNA analysis for the following 77 genes: AIP, ALK, APC, ATM, AXIN2, BAP1, BARD1, BLM, BMPR1A, BRCA1, BRCA2, BRIP1, CDC73, CDH1, CDK4, CDKN1B, CDKN2A, CHEK2, CTNNA1, DICER1, FANCC, FH, FLCN, GALNT12, KIF1B, LZTR1, MAX, MEN1, MET, MLH1, MSH2, MSH3, MSH6, MUTYH, NBN, NF1, NF2, NTHL1, PALB2, PHOX2B, PMS2, POT1, PRKAR1A, PTCH1, PTEN, RAD51C, RAD51D, RB1, RECQL, RET, SDHA, SDHAF2, SDHB, SDHC, SDHD, SMAD4, SMARCA4, SMARCB1, SMARCE1, STK11, SUFU, TMEM127, TP53, TSC1, TSC2, VHL and XRCC2 (sequencing and deletion/duplication); EGFR, EGLN1, HOXB13, KIT, MITF, PDGFRA, POLD1, and POLE (sequencing only); EPCAM and GREM1 (deletion/duplication only).  ?  ?08/05/2021 Surgery  ? Left inferior lumpectomy: 0.3 cm DCIS intermediate grade arising a complex sclerosing lesion extends to the inked medial margin ?Left superior lumpectomy: DCIS intermediate grade as arising in a complex sclerosing lesion,  margins negative ?Medial margin excision: Radial scar with U DH, incidental papilloma ?Previous ER/PR 100% positive ?  ? ? ?CHIEF COMPLIANT: Follow-up of left breast DCIS ? ?INTERVAL HISTORY: Whitney Ford is a  66 y.o. with above-mentioned history of left breast DCIS. Left inferior and superior lumpectomies on 08/05/2021 showed intermediate grade DCIS arising in a complex sclerosing lesion. She presents to the clinic today for follow-up.  She tolerated radiation extremely well without any major problems or concerns.  She is slightly uncomfortable in the breast but denies any pain. ? ? ?ALLERGIES:  has No Known Allergies. ? ?MEDICATIONS:  ?Current Outpatient Medications  ?Medication Sig Dispense Refill  ? atorvastatin (LIPITOR) 20 MG tablet Take 1 tablet (20 mg total) by mouth daily. 30 tablet 6  ? B Complex Vitamins (B COMPLEX PO) Take by mouth.    ? Calcium Carbonate 500 MG CHEW at bedtime. 2 chews at bedtime nightly    ? cetirizine (ZYRTEC) 10 MG tablet Take 10 mg by mouth daily.    ? cholecalciferol (VITAMIN D) 25 MCG (1000 UNIT) tablet Take by mouth daily.    ? Multiple Vitamin (MULTIVITAMIN) tablet Take 1 tablet by mouth daily.    ? olmesartan (BENICAR) 20 MG tablet Take 20 mg by mouth daily.    ? Omega-3 Fatty Acids (FISH OIL) 1200 MG CAPS Take by mouth.    ? ?No current facility-administered medications for this visit.  ? ? ?PHYSICAL EXAMINATION: ?ECOG PERFORMANCE STATUS: 1 - Symptomatic but completely ambulatory ? ?There were no vitals filed for this visit. ?There were no vitals filed for this visit. ? ?BREAST: No palpable masses or nodules in either right or left breasts. No palpable axillary supraclavicular or infraclavicular adenopathy no breast tenderness or nipple discharge. (  exam performed in the presence of a chaperone) ? ?LABORATORY DATA:  ?I have reviewed the data as listed ?CMP Latest Ref Rng & Units 07/09/2021  ?Glucose 70 - 99 mg/dL 82  ?BUN 8 - 23 mg/dL 16  ?Creatinine 0.44 - 1.00 mg/dL 0.82   ?Sodium 135 - 145 mmol/L 144  ?Potassium 3.5 - 5.1 mmol/L 4.7  ?Chloride 98 - 111 mmol/L 106  ?CO2 22 - 32 mmol/L 30  ?Calcium 8.9 - 10.3 mg/dL 9.7  ?Total Protein 6.5 - 8.1 g/dL 6.9  ?Total Bilirubin 0.3 - 1.2 mg/dL 0.4  ?Alkaline Phos 38 - 126 U/L 89  ?AST 15 - 41 U/L 36  ?ALT 0 - 44 U/L 37  ? ? ?Lab Results  ?Component Value Date  ? WBC 6.1 07/09/2021  ? HGB 13.3 07/09/2021  ? HCT 40.3 07/09/2021  ? MCV 93.9 07/09/2021  ? PLT 219 07/09/2021  ? NEUTROABS 3.1 07/09/2021  ? ? ?ASSESSMENT & PLAN:  ?Ductal carcinoma in situ (DCIS) of left breast ?06/25/2021:Screening mammogram detected left breast abnormalities. biopsy on 06/25/2021 showed atypical epithelium 0.5 cm mass at 7:00, and DCIS low-grade 1.1 cm mass at 12:00, ER/PR+ (100%).  ?  ?08/05/21: Left Sup lumpectomy: IG DCIS, Left Inf lumpectomy: IG DCIS, ER/PR Pos ?  ?Treatment Plan: ?1. adjuvant radiation therapy 09/12/2021-10/09/2021 ?2. Followed by antiestrogen therapy with tamoxifen ?5 years to start 10/25/2021 ? ?Tamoxifen counseling:We discussed the risks and benefits of tamoxifen. These include but not limited to insomnia, hot flashes, mood changes, vaginal dryness, and weight gain. Although rare, serious side effects including endometrial cancer, risk of blood clots were also discussed. We strongly believe that the benefits far outweigh the risks. Patient understands these risks and consented to starting treatment. Planned treatment duration is 5 years. ?  ?For surveillance she had a biopsy on the right breast prior to the left breast diagnosis and they recommended a 1-monthfollow-up imaging on the right breast which will be due in July 2023. ? ?RTC in 3 months for survivorship care plan visit ? ? ? ?No orders of the defined types were placed in this encounter. ? ?The patient has a good understanding of the overall plan. she agrees with it. she will call with any problems that may develop before the next visit here. ?Total time spent: 30 mins including face  to face time and time spent for planning, charting and co-ordination of care ? ? VHarriette Ohara MD ?10/09/21 ? ? ? I, DGardiner Coins am acting as a scribe for Dr. GLindi Adie ?

## 2021-10-08 ENCOUNTER — Encounter: Payer: Self-pay | Admitting: Radiation Oncology

## 2021-10-08 ENCOUNTER — Ambulatory Visit: Payer: Medicare Other

## 2021-10-08 ENCOUNTER — Other Ambulatory Visit: Payer: Self-pay

## 2021-10-08 ENCOUNTER — Ambulatory Visit
Admission: RE | Admit: 2021-10-08 | Discharge: 2021-10-08 | Disposition: A | Discharge status: Home / Self Care | Payer: Medicare Other | Source: Ambulatory Visit | Attending: Radiation Oncology | Admitting: Radiation Oncology

## 2021-10-08 DIAGNOSIS — Z510 Encounter for antineoplastic radiation therapy: Secondary | ICD-10-CM | POA: Diagnosis not present

## 2021-10-08 DIAGNOSIS — D0512 Intraductal carcinoma in situ of left breast: Secondary | ICD-10-CM | POA: Diagnosis not present

## 2021-10-09 ENCOUNTER — Ambulatory Visit: Payer: Medicare Other

## 2021-10-09 ENCOUNTER — Other Ambulatory Visit: Payer: Self-pay

## 2021-10-09 ENCOUNTER — Inpatient Hospital Stay: Payer: Medicare Other | Attending: Hematology and Oncology | Admitting: Hematology and Oncology

## 2021-10-09 DIAGNOSIS — D0512 Intraductal carcinoma in situ of left breast: Secondary | ICD-10-CM | POA: Diagnosis not present

## 2021-10-09 MED ORDER — TAMOXIFEN CITRATE 10 MG PO TABS: 10.0000 mg | ORAL_TABLET | Freq: Every day | ORAL | 3 refills | Status: DC

## 2021-10-09 NOTE — Assessment & Plan Note (Signed)
06/25/2021:Screening mammogram detected left breast abnormalities. biopsy on 06/25/2021 showed atypical epithelium 0.5 cm mass at 7:00, and DCIS low-grade 1.1 cm mass at 12:00, ER/PR+ (100%).? ?? ?08/05/21: Left Sup lumpectomy: IG DCIS, Left Inf lumpectomy: IG DCIS, ER/PR Pos ?? ?Treatment Plan: ?1. adjuvant radiation therapy 09/12/2021-10/09/2021 ?2. Followed by antiestrogen therapy with tamoxifen ?5 years to start 10/25/2021 ? ?Letrozole counseling: We discussed the risks and benefits of anti-estrogen therapy with aromatase inhibitors. These include but not limited to insomnia, hot flashes, mood changes, vaginal dryness, bone density loss, and weight gain. We strongly believe that the benefits far outweigh the risks. Patient understands these risks and consented to starting treatment. Planned treatment duration is 5-7 years. ? ?? ?RTC in 3 months for survivorship care plan visit ?

## 2021-10-14 ENCOUNTER — Encounter: Payer: Self-pay | Admitting: *Deleted

## 2021-10-17 ENCOUNTER — Inpatient Hospital Stay: Payer: Medicare Other | Admitting: Licensed Clinical Social Worker

## 2021-10-17 DIAGNOSIS — D0512 Intraductal carcinoma in situ of left breast: Secondary | ICD-10-CM

## 2021-10-17 NOTE — Progress Notes (Signed)
[place] Advance Directives ?Clinical Social Work ? ?Patient presented to [facility]  to review and complete healthcare advance directives.  Clinical Social Worker met with patient and spouse.  The patient designated Yessenia Maillet as their primary healthcare agent and did not name a secondary agent.  Patient also completed healthcare living will.   ? ?Documents were notarized and copies made for patient/family. Clinical Social Worker will send documents to medical records to be scanned into patient's chart. ?Clinical Social Worker encouraged patient/family to contact with any additional questions or concerns. ? ? ?Marvella Jenning E Arcelia Pals, LCSW ?Clinical Social Worker ?[place]      ? ?

## 2021-10-21 NOTE — Progress Notes (Signed)
? ?                                                                                                                                                          ?  Patient Name: Whitney Ford ?MRN: [id number] ?DOB: [birth year] ?Referring Physician: Moreen Fowler DAVID (Profile Not Attached) ?Date of Service: 10/08/2021 ?[facility], [place] ? ?                                                      End Of Treatment Note ? ?Diagnoses: D05.12-Intraductal carcinoma in situ of left breast ? ?Cancer Staging: ER/PR positive, Intermediate-grade, ER/PR positive DCIS of the left breast ? ?Intent: Curative ? ?Radiation Treatment Dates: 09/11/2021 through 10/08/2021 ?Site Technique Total Dose (Gy) Dose per Fx (Gy) Completed Fx Beam Energies  ?Breast, Left: Breast_L 3D 42.56/42.56 2.66 16/16 6XFFF  ?Breast, Left: Breast_L_Bst 3D 10/10 2.5 4/4 6X, 10X  ? ?Narrative: The patient tolerated radiation therapy relatively well. She developed fatigue and anticipated skin changes in the treatment field.  ? ?Plan: The patient will receive a call in about one month from the radiation oncology department. She will continue follow up with Dr. Lindi Adie as well.  ? ?________________________________________________ ? ? ? ?Carola Rhine, PAC  ?

## 2021-11-10 NOTE — Progress Notes (Signed)
?  Radiation Oncology         [phone] ?________________________________ ? ?Name: Whitney Ford MRN: [id number]  ?Date of Service: 11/17/2021  DOB: [birth year] ? ?Post Treatment Telephone Note ? ?Diagnosis:   ER/PR positive, Intermediate-grade, ER/PR positive DCIS of the left breast ? ?Intent: Curative ? ?Radiation Treatment Dates: 09/11/2021 through 10/08/2021 ?Site Technique Total Dose (Gy) Dose per Fx (Gy) Completed Fx Beam Energies  ?Breast, Left: Breast_L 3D 42.56/42.56 2.66 16/16 6XFFF  ?Breast, Left: Breast_L_Bst 3D 10/10 2.5 4/4 6X, 10X  ? ?Narrative: The patient tolerated radiation therapy relatively well. She developed fatigue and anticipated skin changes in the treatment field. She's had some skin peeling especially a skin tag that had been irritated during radiation.  ? ?Impression/Plan: ?1. ER/PR positive, Intermediate-grade, ER/PR positive DCIS of the left breast. The patient has been doing well since completion of radiotherapy. We discussed that we would be happy to continue to follow her as needed, but she will also continue to follow up with Dr. Lindi Adie in medical oncology. She was counseled on skin care as well as measures to avoid sun exposure to this area.  ?2. Survivorship. We discussed the importance of survivorship evaluation and encouraged her to attend her upcoming visit with that clinic. ? ? ? ? ? ?Carola Rhine, PAC  ? ? ? ? ?

## 2021-11-17 ENCOUNTER — Ambulatory Visit
Admission: RE | Admit: 2021-11-17 | Discharge: 2021-11-17 | Disposition: A | Discharge status: Home / Self Care | Payer: Medicare Other | Source: Ambulatory Visit | Attending: Radiation Oncology | Admitting: Radiation Oncology

## 2021-11-17 DIAGNOSIS — E7800 Pure hypercholesterolemia, unspecified: Secondary | ICD-10-CM | POA: Diagnosis not present

## 2021-11-17 DIAGNOSIS — D0512 Intraductal carcinoma in situ of left breast: Secondary | ICD-10-CM

## 2021-11-17 DIAGNOSIS — M25551 Pain in right hip: Secondary | ICD-10-CM | POA: Diagnosis not present

## 2021-11-17 DIAGNOSIS — Z1159 Encounter for screening for other viral diseases: Secondary | ICD-10-CM | POA: Diagnosis not present

## 2021-11-17 DIAGNOSIS — I150 Renovascular hypertension: Secondary | ICD-10-CM | POA: Diagnosis not present

## 2021-11-17 DIAGNOSIS — M85852 Other specified disorders of bone density and structure, left thigh: Secondary | ICD-10-CM | POA: Diagnosis not present

## 2021-11-17 DIAGNOSIS — Z0000 Encounter for general adult medical examination without abnormal findings: Secondary | ICD-10-CM | POA: Diagnosis not present

## 2021-11-26 DIAGNOSIS — M8589 Other specified disorders of bone density and structure, multiple sites: Secondary | ICD-10-CM | POA: Diagnosis not present

## 2021-12-08 DIAGNOSIS — M25551 Pain in right hip: Secondary | ICD-10-CM | POA: Diagnosis not present

## 2022-01-02 ENCOUNTER — Telehealth: Payer: Self-pay | Admitting: *Deleted

## 2022-01-09 ENCOUNTER — Inpatient Hospital Stay: Payer: Medicare Other | Attending: Adult Health | Admitting: Adult Health

## 2022-01-09 ENCOUNTER — Telehealth: Payer: Self-pay | Admitting: Hematology and Oncology

## 2022-01-09 ENCOUNTER — Encounter: Payer: Self-pay | Admitting: Adult Health

## 2022-01-09 ENCOUNTER — Other Ambulatory Visit: Payer: Self-pay

## 2022-01-09 VITALS — BP 128/67 | Temp 97.8°F | Ht 63.0 in | Wt 162.3 lb

## 2022-01-09 DIAGNOSIS — D0512 Intraductal carcinoma in situ of left breast: Secondary | ICD-10-CM | POA: Diagnosis not present

## 2022-01-09 DIAGNOSIS — Z8042 Family history of malignant neoplasm of prostate: Secondary | ICD-10-CM | POA: Diagnosis not present

## 2022-01-09 DIAGNOSIS — Z803 Family history of malignant neoplasm of breast: Secondary | ICD-10-CM | POA: Diagnosis not present

## 2022-01-09 DIAGNOSIS — Z807 Family history of other malignant neoplasms of lymphoid, hematopoietic and related tissues: Secondary | ICD-10-CM | POA: Diagnosis not present

## 2022-01-09 NOTE — Telephone Encounter (Signed)
Scheduled appointment per 6/16 los. Patient is aware.

## 2022-01-09 NOTE — Progress Notes (Signed)
SURVIVORSHIP VISIT:   BRIEF ONCOLOGIC HISTORY:  Oncology History  Ductal carcinoma in situ (DCIS) of left breast  06/25/2021 Initial Diagnosis   Screening mammogram detected left breast abnormalities. biopsy on 06/25/2021 showed atypical epithelium 0.5 cm mass at 7:00, and DCIS low-grade 1.1 cm mass at 12:00, ER/PR+ (100%).     Genetic Testing   [facility] CancerNext-Expanded Panel+RNA is Negative. Report date is 07/24/2021.  The CancerNext-Expanded gene panel offered by [facility] and includes sequencing, rearrangement, and RNA analysis for the following 77 genes: AIP, ALK, APC, ATM, AXIN2, BAP1, BARD1, BLM, BMPR1A, BRCA1, BRCA2, BRIP1, CDC73, CDH1, CDK4, CDKN1B, CDKN2A, CHEK2, CTNNA1, DICER1, FANCC, FH, FLCN, GALNT12, KIF1B, LZTR1, MAX, MEN1, MET, MLH1, MSH2, MSH3, MSH6, MUTYH, NBN, NF1, NF2, NTHL1, PALB2, PHOX2B, PMS2, POT1, PRKAR1A, PTCH1, PTEN, RAD51C, RAD51D, RB1, RECQL, RET, SDHA, SDHAF2, SDHB, SDHC, SDHD, SMAD4, SMARCA4, SMARCB1, SMARCE1, STK11, SUFU, TMEM127, TP53, TSC1, TSC2, VHL and XRCC2 (sequencing and deletion/duplication); EGFR, EGLN1, HOXB13, KIT, MITF, PDGFRA, POLD1, and POLE (sequencing only); EPCAM and GREM1 (deletion/duplication only).    08/05/2021 Surgery   Left inferior lumpectomy: 0.3 cm DCIS intermediate grade arising a complex sclerosing lesion extends to the inked medial margin Left superior lumpectomy: DCIS intermediate grade as arising in a complex sclerosing lesion, margins negative Medial margin excision: Radial scar with U DH, incidental papilloma Previous ER/PR 100% positive   09/11/2021 - 10/08/2021 Radiation Therapy   Site Technique Total Dose (Gy) Dose per Fx (Gy) Completed Fx Beam Energies  Breast, Left: Breast_L 3D 42.56/42.56 2.66 16/16 6XFFF  Breast, Left: Breast_L_Bst 3D 10/10 2.5 4/4 6X, 10X     10/25/2021 -  Anti-estrogen oral therapy   Tamoxifen x 5 years     INTERVAL HISTORY:  Whitney Ford to review her survivorship care plan detailing her treatment  course for breast cancer, as well as monitoring long-term side effects of that treatment, education regarding health maintenance, screening, and overall wellness and health promotion.     Overall, Whitney Ford reports feeling quite well.  She is taking tamoxifen daily with good tolerance.  She has occasional hot flashes but these are not anything that she would describe is significant, concerning, or worse.  REVIEW OF SYSTEMS:  Review of Systems  Constitutional:  Negative for appetite change, chills, fatigue, fever and unexpected weight change.  HENT:   Negative for hearing loss, lump/mass and trouble swallowing.   Eyes:  Negative for eye problems and icterus.  Respiratory:  Negative for chest tightness, cough and shortness of breath.   Cardiovascular:  Negative for chest pain, leg swelling and palpitations.  Gastrointestinal:  Negative for abdominal distention, abdominal pain, constipation, diarrhea, nausea and vomiting.  Endocrine: Positive for hot flashes.  Genitourinary:  Negative for difficulty urinating.   Musculoskeletal:  Negative for arthralgias.  Skin:  Negative for itching and rash.  Neurological:  Negative for dizziness, extremity weakness, headaches and numbness.  Hematological:  Negative for adenopathy. Does not bruise/bleed easily.  Psychiatric/Behavioral:  Negative for depression. The patient is not nervous/anxious.    Breast: Denies any new nodularity, masses, tenderness, nipple changes, or nipple discharge.      ONCOLOGY TREATMENT TEAM:  1. Surgeon:  Dr. Donne Hazel at [facility] 2. Medical Oncologist: Dr. Lindi Adie 3. Radiation Oncologist: Dr. Lisbeth Renshaw    PAST MEDICAL/SURGICAL HISTORY:  Past Medical History:  Diagnosis Date   Bradycardia, sinus    Breast cancer ([place])    Dry eyes    Elevated LDL cholesterol level 12/07/2019   Estrogen deficiency  Fibromuscular dysplasia ([facility])    Hives    Irregular heartbeat    PONV (postoperative nausea and vomiting)     Renovascular hypertension    Past Surgical History:  Procedure Laterality Date   BREAST BIOPSY Right 07/17/2021   MRI Bx   BREAST LUMPECTOMY WITH RADIOACTIVE SEED LOCALIZATION Left 08/05/2021   Procedure: LEFT BREAST LUMPECTOMY WITH RADIOACTIVE SEED LOCALIZATION;  Surgeon: Rolm Bookbinder, MD;  Location: [place];  Service: General;  Laterality: Left;   BTL     CHILDBIRTH     RADIOACTIVE SEED GUIDED EXCISIONAL BREAST BIOPSY Left 08/05/2021   Procedure: RADIOACTIVE SEED GUIDED EXCISIONAL LEFT BREAST BIOPSY;  Surgeon: Rolm Bookbinder, MD;  Location: [place];  Service: General;  Laterality: Left;     ALLERGIES:  No Known Allergies   CURRENT MEDICATIONS:  Outpatient Encounter Medications as of 01/09/2022  Medication Sig   Calcium Carbonate 500 MG CHEW at bedtime. 2 chews at bedtime nightly   cetirizine (ZYRTEC) 10 MG tablet Take 10 mg by mouth daily.   cholecalciferol (VITAMIN D) 25 MCG (1000 UNIT) tablet Take by mouth daily.   Multiple Vitamin (MULTIVITAMIN) tablet Take 1 tablet by mouth daily.   olmesartan (BENICAR) 20 MG tablet Take 20 mg by mouth daily.   Omega-3 Fatty Acids (FISH OIL) 1200 MG CAPS Take by mouth.   tamoxifen (NOLVADEX) 10 MG tablet Take 1 tablet (10 mg total) by mouth daily.   atorvastatin (LIPITOR) 20 MG tablet Take 1 tablet (20 mg total) by mouth daily.   [DISCONTINUED] B Complex Vitamins (B COMPLEX PO) Take by mouth.   No facility-administered encounter medications on file as of 01/09/2022.     ONCOLOGIC FAMILY HISTORY:  Family History  Problem Relation Age of Onset   Hypertension Mother    High Cholesterol Mother    Breast cancer Mother 60   Stroke Father    Multiple myeloma Father 55   Diabetes Sister    Prostate cancer Brother        dx. mid 80s   Stroke Maternal Grandmother    Diabetes Paternal Grandmother    Melanoma Daughter 74       SOCIAL HISTORY:  Social History   Socioeconomic History    Marital status: Married    Spouse name: Not on file   Number of children: Not on file   Years of education: Not on file   Highest education level: Not on file  Occupational History   Not on file  Tobacco Use   Smoking status: Never   Smokeless tobacco: Never  Substance and Sexual Activity   Alcohol use: Yes    Alcohol/week: 2.0 standard drinks of alcohol    Types: 2 Glasses of wine per week   Drug use: Never   Sexual activity: Not on file    Comment: charlie  Other Topics Concern   Not on file  Social History Narrative   Not on file   Social Determinants of Health   Financial Resource Strain: Not on file  Food Insecurity: Not on file  Transportation Needs: Not on file  Physical Activity: Not on file  Stress: Not on file  Social Connections: Not on file  Intimate Partner Violence: Not on file     OBSERVATIONS/OBJECTIVE:  BP 128/67   Temp 97.8 F (36.6 C) (Oral)   Ht 5' 3"  (1.6 m)   Wt 162 lb 4.8 oz (73.6 kg)   LMP  (LMP Unknown)   SpO2 100%  BMI 28.75 kg/m  GENERAL: Patient is a well appearing female in no acute distress HEENT:  Sclerae anicteric.  Oropharynx clear and moist. No ulcerations or evidence of oropharyngeal candidiasis. Neck is supple.  NODES:  No cervical, supraclavicular, or axillary lymphadenopathy palpated.  BREAST EXAM: Left breast status postlumpectomy and radiation no sign of local recurrence right breast is benign. LUNGS:  Clear to auscultation bilaterally.  No wheezes or rhonchi. HEART:  Regular rate and rhythm. No murmur appreciated. ABDOMEN:  Soft, nontender.  Positive, normoactive bowel sounds. No organomegaly palpated. MSK:  No focal spinal tenderness to palpation. Full range of motion bilaterally in the upper extremities. EXTREMITIES:  No peripheral edema.   SKIN:  Clear with no obvious rashes or skin changes. No nail dyscrasia. NEURO:  Nonfocal. Well oriented.  Appropriate affect.   LABORATORY DATA:  None for this  visit.  DIAGNOSTIC IMAGING:  None for this visit.      ASSESSMENT AND PLAN:  Ms.. Ford is a pleasant 66 y.o. female with Stage 0 left breast DCIS, ER+/PR+, diagnosed in 05/2021, treated with lumpectomy, adjuvant radiation therapy, and anti-estrogen therapy with Tamoxifen beginning in 10/2021.  She presents to the Survivorship Clinic for our initial meeting and routine follow-up post-completion of treatment for breast cancer.    1. Stage 0 left breast cancer:  Whitney Ford is continuing to recover from definitive treatment for breast cancer. She will follow-up with her medical oncologist, Dr. Lindi Adie in 6 months with history and physical exam per surveillance protocol.  She will continue her anti-estrogen therapy with Tamoxifen. Thus far, she is tolerating the Tamoxifen well, with minimal side effects. She was instructed to make Dr. Lindi Adie or myself aware if she begins to experience any worsening side effects of the medication and I could see her back in clinic to help manage those side effects, as needed. Her mammogram is due 05/2022; orders placed today.  I placed a referral to gynecology since she is taking tamoxifen so she can undergo annual pelvic exams.  Her most recent breast MRI completed at the time of diagnosis showed an indeterminate right mass that was later biopsied and found to be usual ductal and columnar cell hyperplasia, fibrocystic changes with apically metaplasia-negative for atypia or malignancy on the right breast retroareolar.  A repeat MRI was recommended to be completed in June 2023.  I placed orders for this to be completed this week.  Today, a comprehensive survivorship care plan and treatment summary was reviewed with the patient today detailing her breast cancer diagnosis, treatment course, potential late/long-term effects of treatment, appropriate follow-up care with recommendations for the future, and patient education resources.  A copy of this summary, along with a letter will  be sent to the patient's primary care provider via mail/fax/In Basket message after today's visit.    2. Bone health:   She follows with Dr. Rockwell Ford for this and her most recent bone density was in April of this year.  She was given education on specific activities to promote bone health.  Notes tamoxifen has a protective effect on the bones she will continue to follow with Dr. Rockwell Ford about her bone density.  3. Cancer screening:  Due to Whitney Ford's history and her age, she should receive screening for skin cancers, colon cancer, and gynecologic cancers.  The information and recommendations are listed on the patient's comprehensive care plan/treatment summary and were reviewed in detail with the patient.    4. Health maintenance and wellness promotion: Whitney Ford  was encouraged to consume 5-7 servings of fruits and vegetables per day. We reviewed the "Nutrition Rainbow" handout.  She was also encouraged to engage in moderate to vigorous exercise for 30 minutes per day most days of the week. We discussed the [employer] fitness program, which is designed for cancer survivors to help them become more physically fit after cancer treatments.  She was instructed to limit her alcohol consumption and continue to abstain from tobacco use.     5. Support services/counseling: It is not uncommon for this period of the patient's cancer care trajectory to be one of many emotions and stressors. She was given information regarding our available services and encouraged to contact me with any questions or for help enrolling in any of our support group/programs.    Follow up instructions:    -Return to cancer center in 1 year months for f/u with Dr. Lindi Adie  -Mammogram due in 05/2022 -Right breast MRI in 12/2021 -Follow up with surgery in 6 months -She is welcome to return back to the Survivorship Clinic at any time; no additional follow-up needed at this time.  -Consider referral back to survivorship as a long-term  survivor for continued surveillance  The patient was provided an opportunity to ask questions and all were answered. The patient agreed with the plan and demonstrated an understanding of the instructions.   Total encounter time:30 minutes*in face-to-face visit time, chart review, lab review, care coordination, order entry, and documentation of the encounter time.    Wilber Bihari, NP 01/09/22 9:20 AM Medical Oncology and Hematology [facility], [place] [address] Tel. [phone]    Fax. [phone]  *Total Encounter Time as defined by the Centers for Medicare and Medicaid Services includes, in addition to the face-to-face time of a patient visit (documented in the note above) non-face-to-face time: obtaining and reviewing outside history, ordering and reviewing medications, tests or procedures, care coordination (communications with other health care professionals or caregivers) and documentation in the medical record.

## 2022-01-16 ENCOUNTER — Other Ambulatory Visit: Payer: Medicare Other

## 2022-01-28 ENCOUNTER — Ambulatory Visit: Payer: Medicare Other | Admitting: Obstetrics and Gynecology

## 2022-01-28 ENCOUNTER — Other Ambulatory Visit (HOSPITAL_COMMUNITY)
Admission: RE | Admit: 2022-01-28 | Discharge: 2022-01-28 | Disposition: A | Discharge status: Home / Self Care | Payer: Medicare Other | Source: Ambulatory Visit | Attending: Obstetrics and Gynecology | Admitting: Obstetrics and Gynecology

## 2022-01-28 ENCOUNTER — Encounter: Payer: Self-pay | Admitting: Obstetrics and Gynecology

## 2022-01-28 VITALS — BP 136/82 | HR 88 | Ht 64.0 in | Wt 161.0 lb

## 2022-01-28 DIAGNOSIS — M85852 Other specified disorders of bone density and structure, left thigh: Secondary | ICD-10-CM | POA: Insufficient documentation

## 2022-01-28 DIAGNOSIS — Z124 Encounter for screening for malignant neoplasm of cervix: Secondary | ICD-10-CM

## 2022-01-28 DIAGNOSIS — D0512 Intraductal carcinoma in situ of left breast: Secondary | ICD-10-CM

## 2022-01-28 DIAGNOSIS — I150 Renovascular hypertension: Secondary | ICD-10-CM | POA: Insufficient documentation

## 2022-01-28 DIAGNOSIS — E2839 Other primary ovarian failure: Secondary | ICD-10-CM | POA: Insufficient documentation

## 2022-01-28 DIAGNOSIS — Z9189 Other specified personal risk factors, not elsewhere classified: Secondary | ICD-10-CM

## 2022-01-28 DIAGNOSIS — I773 Arterial fibromuscular dysplasia: Secondary | ICD-10-CM | POA: Insufficient documentation

## 2022-01-28 DIAGNOSIS — R202 Paresthesia of skin: Secondary | ICD-10-CM | POA: Insufficient documentation

## 2022-01-28 DIAGNOSIS — H04123 Dry eye syndrome of bilateral lacrimal glands: Secondary | ICD-10-CM | POA: Insufficient documentation

## 2022-01-28 DIAGNOSIS — E7800 Pure hypercholesterolemia, unspecified: Secondary | ICD-10-CM | POA: Insufficient documentation

## 2022-01-28 DIAGNOSIS — D0590 Unspecified type of carcinoma in situ of unspecified breast: Secondary | ICD-10-CM | POA: Insufficient documentation

## 2022-01-28 DIAGNOSIS — H547 Unspecified visual loss: Secondary | ICD-10-CM | POA: Insufficient documentation

## 2022-01-28 NOTE — Addendum Note (Signed)
Addended by: Dorothy Spark on: 01/28/2022 04:57 PM   Modules accepted: Orders

## 2022-01-28 NOTE — Progress Notes (Signed)
66 y.o. No obstetric history on file. Married White or Caucasian Not Hispanic or Latino female here for annual exam following breast cancer. She had breast surgery this year in Jan.   Had left lumpectomy in 1/23, s/p radiation. Doing great.   No vaginal bleeding. No bowel or bladder issues.  Sexually active, no dyspareunia.     No LMP recorded (lmp unknown). Patient is postmenopausal.          Sexually active: Yes.    The current method of family planning is post menopausal status.    Exercising: Yes.     Swimming biking running hiking walking  Smoker:  no  Health Maintenance: Pap:  06/07/18 WNL  History of abnormal Pap:  no MMG:  09/25/21 requested  BMD:   at [facility] requested, osteopenia, followed by primary care.  Colonoscopy: at age 39 f/u 10 years  TDaP:  22 Gardasil: n/a   reports that she has never smoked. She has never used smokeless tobacco. She reports current alcohol use of about 2.0 standard drinks of alcohol per week. She reports that she does not use drugs. 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 3 kids, 2 grandchildren (73 & 57).   Past Medical History:  Diagnosis Date   Bradycardia, sinus    Breast cancer (HCC)    Dry eyes    Elevated LDL cholesterol level 12/07/2019   Estrogen deficiency    Fibromuscular dysplasia (HCC)    Hives    Irregular heartbeat    PONV (postoperative nausea and vomiting)    Renovascular hypertension     Past Surgical History:  Procedure Laterality Date   BREAST BIOPSY Right 07/17/2021   MRI Bx   BREAST LUMPECTOMY WITH RADIOACTIVE SEED LOCALIZATION Left 08/05/2021   Procedure: LEFT BREAST LUMPECTOMY WITH RADIOACTIVE SEED LOCALIZATION;  Surgeon: Rolm Bookbinder, MD;  Location: [place];  Service: General;  Laterality: Left;   BTL     CHILDBIRTH     RADIOACTIVE SEED GUIDED EXCISIONAL BREAST BIOPSY Left 08/05/2021   Procedure: RADIOACTIVE SEED GUIDED EXCISIONAL LEFT BREAST BIOPSY;  Surgeon: Rolm Bookbinder, MD;  Location:  [place];  Service: General;  Laterality: Left;    Current Outpatient Medications  Medication Sig Dispense Refill   atorvastatin (LIPITOR) 20 MG tablet 1 tablet     Calcium Carbonate 500 MG CHEW at bedtime. 2 chews at bedtime nightly     cetirizine (ZYRTEC) 10 MG tablet Take 10 mg by mouth daily.     cholecalciferol (VITAMIN D) 25 MCG (1000 UNIT) tablet Take by mouth daily.     Multiple Vitamin (MULTIVITAMIN) tablet Take 1 tablet by mouth daily.     olmesartan (BENICAR) 20 MG tablet Take 20 mg by mouth daily.     Omega-3 Fatty Acids (FISH OIL) 1200 MG CAPS Take by mouth.     tamoxifen (NOLVADEX) 10 MG tablet Take 1 tablet (10 mg total) by mouth daily. 90 tablet 3   No current facility-administered medications for this visit.    Family History  Problem Relation Age of Onset   Hypertension Mother    High Cholesterol Mother    Breast cancer Mother 61   Stroke Father    Multiple myeloma Father 32   Diabetes Sister    Prostate cancer Brother        dx. mid 74s   Stroke Maternal Grandmother    Diabetes Paternal Grandmother    Melanoma Daughter 29    Review of Systems  Exam:  BP 136/82   Pulse 88   Ht 5' 4"  (1.626 m)   Wt 161 lb (73 kg)   LMP  (LMP Unknown)   SpO2 100%   BMI 27.64 kg/m   Weight change: @WEIGHTCHANGE @ Height:   Height: 5' 4"  (162.6 cm)  Ht Readings from Last 3 Encounters:  01/28/22 5' 4"  (1.626 m)  01/09/22 5' 3"  (1.6 m)  10/09/21 5' 3"  (1.6 m)    General appearance: alert, cooperative and appears stated age Head: Normocephalic, without obvious abnormality, atraumatic Neck: no adenopathy, supple, symmetrical, trachea midline and thyroid normal to inspection and palpation Breasts: normal appearance, no masses or tenderness Abdomen: soft, non-tender; non distended,  no masses,  no organomegaly Extremities: extremities normal, atraumatic, no cyanosis or edema Skin: Skin color, texture, turgor normal. No rashes or lesions Lymph nodes:  Cervical, supraclavicular, and axillary nodes normal. No abnormal inguinal nodes palpated Neurologic: Grossly normal   Pelvic: External genitalia:  no lesions              Urethra:  normal appearing urethra with no masses, tenderness or lesions              Bartholins and Skenes: normal                 Vagina: normal appearing vagina with normal color and discharge, no lesions              Cervix: no lesions               Bimanual Exam:  Uterus:  normal size, contour, position, consistency, mobility, non-tender              Adnexa: no mass, fullness, tenderness               Rectovaginal: Confirms               Anus:  normal sphincter tone, no lesions  Lovena Le, CMA chaperoned for the exam.  1. GYN exam for high-risk Medicare patient Discussed breast self exam Discussed calcium and vit D intake Mammogram UTD Colonoscopy UTD DEXA UTD Labs with primary  2. Ductal carcinoma in situ (DCIS) of left breast Followed by Oncology

## 2022-01-30 LAB — CYTOLOGY - PAP: Diagnosis: NEGATIVE

## 2022-02-09 ENCOUNTER — Ambulatory Visit
Admission: RE | Admit: 2022-02-09 | Discharge: 2022-02-09 | Disposition: A | Discharge status: Home / Self Care | Payer: Medicare Other | Source: Ambulatory Visit | Attending: Adult Health | Admitting: Adult Health

## 2022-02-09 DIAGNOSIS — D0512 Intraductal carcinoma in situ of left breast: Secondary | ICD-10-CM | POA: Diagnosis not present

## 2022-02-09 MED ORDER — GADOBUTROL 1 MMOL/ML IV SOLN
8.0000 mL | Freq: Once | INTRAVENOUS | Status: AC | PRN
  Administered 2022-02-09: 8 mL via INTRAVENOUS

## 2022-02-10 ENCOUNTER — Telehealth: Payer: Self-pay

## 2022-02-10 NOTE — Telephone Encounter (Signed)
-----   Message from Gardenia Phlegm, NP sent at 02/10/2022  3:26 PM EDT ----- MRI looks good.  Please let patient know ----- Message ----- From: Interface, Rad Results In Sent: 02/10/2022   8:21 AM EDT To: Gardenia Phlegm, NP

## 2022-02-10 NOTE — Telephone Encounter (Signed)
Called pt to review results per NP. Pt verbalized thanks and understanding.

## 2022-03-16 DIAGNOSIS — D225 Melanocytic nevi of trunk: Secondary | ICD-10-CM | POA: Diagnosis not present

## 2022-03-16 DIAGNOSIS — Z1283 Encounter for screening for malignant neoplasm of skin: Secondary | ICD-10-CM | POA: Diagnosis not present

## 2022-05-18 DIAGNOSIS — E7800 Pure hypercholesterolemia, unspecified: Secondary | ICD-10-CM | POA: Diagnosis not present

## 2022-05-18 DIAGNOSIS — I773 Arterial fibromuscular dysplasia: Secondary | ICD-10-CM | POA: Diagnosis not present

## 2022-05-18 DIAGNOSIS — R001 Bradycardia, unspecified: Secondary | ICD-10-CM | POA: Diagnosis not present

## 2022-05-18 DIAGNOSIS — I150 Renovascular hypertension: Secondary | ICD-10-CM | POA: Diagnosis not present

## 2022-05-27 DIAGNOSIS — Z853 Personal history of malignant neoplasm of breast: Secondary | ICD-10-CM | POA: Diagnosis not present

## 2022-05-27 DIAGNOSIS — R928 Other abnormal and inconclusive findings on diagnostic imaging of breast: Secondary | ICD-10-CM | POA: Diagnosis not present

## 2022-05-29 ENCOUNTER — Encounter: Payer: Self-pay | Admitting: Hematology and Oncology

## 2022-06-26 DIAGNOSIS — D0512 Intraductal carcinoma in situ of left breast: Secondary | ICD-10-CM | POA: Diagnosis not present

## 2022-10-05 ENCOUNTER — Other Ambulatory Visit: Payer: Self-pay

## 2022-10-05 MED ORDER — TAMOXIFEN CITRATE 10 MG PO TABS: 10.0000 mg | ORAL_TABLET | Freq: Every day | ORAL | 3 refills | Status: DC

## 2022-11-23 DIAGNOSIS — E559 Vitamin D deficiency, unspecified: Secondary | ICD-10-CM | POA: Diagnosis not present

## 2022-11-23 DIAGNOSIS — I773 Arterial fibromuscular dysplasia: Secondary | ICD-10-CM | POA: Diagnosis not present

## 2022-11-23 DIAGNOSIS — E7800 Pure hypercholesterolemia, unspecified: Secondary | ICD-10-CM | POA: Diagnosis not present

## 2022-11-23 DIAGNOSIS — I150 Renovascular hypertension: Secondary | ICD-10-CM | POA: Diagnosis not present

## 2022-11-23 DIAGNOSIS — R001 Bradycardia, unspecified: Secondary | ICD-10-CM | POA: Diagnosis not present

## 2022-11-23 DIAGNOSIS — Z0000 Encounter for general adult medical examination without abnormal findings: Secondary | ICD-10-CM | POA: Diagnosis not present

## 2022-11-23 DIAGNOSIS — M545 Low back pain, unspecified: Secondary | ICD-10-CM | POA: Diagnosis not present

## 2022-11-23 DIAGNOSIS — M85852 Other specified disorders of bone density and structure, left thigh: Secondary | ICD-10-CM | POA: Diagnosis not present

## 2022-11-23 DIAGNOSIS — R252 Cramp and spasm: Secondary | ICD-10-CM | POA: Diagnosis not present

## 2022-12-10 DIAGNOSIS — H35373 Puckering of macula, bilateral: Secondary | ICD-10-CM | POA: Diagnosis not present

## 2022-12-17 DIAGNOSIS — Z1283 Encounter for screening for malignant neoplasm of skin: Secondary | ICD-10-CM | POA: Diagnosis not present

## 2022-12-17 DIAGNOSIS — D225 Melanocytic nevi of trunk: Secondary | ICD-10-CM | POA: Diagnosis not present

## 2022-12-28 NOTE — Progress Notes (Signed)
Patient Care Team: Tally Joe, MD as PCP - General (Family Medicine) Quintella Reichert, MD as PCP - Cardiology (Cardiology) Emelia Loron, MD as Consulting Physician (General Surgery) Serena Croissant, MD as Consulting Physician (Hematology and Oncology) Dorothy Puffer, MD as Consulting Physician (Radiation Oncology) Romualdo Bolk, MD as Consulting Physician (Obstetrics and Gynecology)  DIAGNOSIS:  Encounter Diagnosis  Name Primary?   Ductal carcinoma in situ (DCIS) of left breast Yes    SUMMARY OF ONCOLOGIC HISTORY: Oncology History  Ductal carcinoma in situ (DCIS) of left breast  06/25/2021 Initial Diagnosis   Screening mammogram detected left breast abnormalities. biopsy on 06/25/2021 showed atypical epithelium 0.5 cm mass at 7:00, and DCIS low-grade 1.1 cm mass at 12:00, ER/PR+ (100%).     Genetic Testing   [facility] CancerNext-Expanded Panel+RNA is Negative. Report date is 07/24/2021.  The CancerNext-Expanded gene panel offered by [facility] and includes sequencing, rearrangement, and RNA analysis for the following 77 genes: AIP, ALK, APC, ATM, AXIN2, BAP1, BARD1, BLM, BMPR1A, BRCA1, BRCA2, BRIP1, CDC73, CDH1, CDK4, CDKN1B, CDKN2A, CHEK2, CTNNA1, DICER1, FANCC, FH, FLCN, GALNT12, KIF1B, LZTR1, MAX, MEN1, MET, MLH1, MSH2, MSH3, MSH6, MUTYH, NBN, NF1, NF2, NTHL1, PALB2, PHOX2B, PMS2, POT1, PRKAR1A, PTCH1, PTEN, RAD51C, RAD51D, RB1, RECQL, RET, SDHA, SDHAF2, SDHB, SDHC, SDHD, SMAD4, SMARCA4, SMARCB1, SMARCE1, STK11, SUFU, TMEM127, TP53, TSC1, TSC2, VHL and XRCC2 (sequencing and deletion/duplication); EGFR, EGLN1, HOXB13, KIT, MITF, PDGFRA, POLD1, and POLE (sequencing only); EPCAM and GREM1 (deletion/duplication only).    08/05/2021 Surgery   Left inferior lumpectomy: 0.3 cm DCIS intermediate grade arising a complex sclerosing lesion extends to the inked medial margin Left superior lumpectomy: DCIS intermediate grade as arising in a complex sclerosing lesion, margins  negative Medial margin excision: Radial scar with U DH, incidental papilloma Previous ER/PR 100% positive   09/11/2021 - 10/08/2021 Radiation Therapy   Site Technique Total Dose (Gy) Dose per Fx (Gy) Completed Fx Beam Energies  Breast, Left: Breast_L 3D 42.56/42.56 2.66 16/16 6XFFF  Breast, Left: Breast_L_Bst 3D 10/10 2.5 4/4 6X, 10X     10/25/2021 -  Anti-estrogen oral therapy   Tamoxifen x 5 years     CHIEF COMPLIANT: Follow-up of left breast DCIS   INTERVAL HISTORY: Whitney Ford is a  67 y.o. with above-mentioned history of left breast DCIS. She presents to the clinic for a follow-up.  She is currently taking tamoxifen 10 mg daily.  She reports that initially she had a lot of hot flashes but this subsided gradually.  The major complaint is that she has gained about 7 pounds over the past year and spite of being extremely physically active athletic with exercise and swimming regularly.  ALLERGIES:  has No Known Allergies.  MEDICATIONS:  Current Outpatient Medications  Medication Sig Dispense Refill   atorvastatin (LIPITOR) 20 MG tablet 1 tablet     Calcium Carbonate 500 MG CHEW at bedtime. 2 chews at bedtime nightly     cetirizine (ZYRTEC) 10 MG tablet Take 10 mg by mouth daily.     cholecalciferol (VITAMIN D) 25 MCG (1000 UNIT) tablet Take by mouth daily.     Multiple Vitamin (MULTIVITAMIN) tablet Take 1 tablet by mouth daily.     olmesartan (BENICAR) 20 MG tablet Take 20 mg by mouth daily.     Omega-3 Fatty Acids (FISH OIL) 1200 MG CAPS Take by mouth.     tamoxifen (NOLVADEX) 10 MG tablet Take 1 tablet (10 mg total) by mouth daily. 90 tablet 3   No current  facility-administered medications for this visit.    PHYSICAL EXAMINATION: ECOG PERFORMANCE STATUS: 1 - Symptomatic but completely ambulatory  Vitals:   01/11/23 0832  BP: 135/62  Pulse: 65  Resp: 18  Temp: 97.8 F (36.6 C)  SpO2: 99%   Filed Weights   01/11/23 0832  Weight: 164 lb 9.6 oz (74.7 kg)    BREAST: No  palpable masses or nodules in either right or left breasts. No palpable axillary supraclavicular or infraclavicular adenopathy no breast tenderness or nipple discharge. (exam performed in the presence of a chaperone)  LABORATORY DATA:  I have reviewed the data as listed    Latest Ref Rng & Units 07/09/2021   12:20 PM  CMP  Glucose 70 - 99 mg/dL 82   BUN 8 - 23 mg/dL 16   Creatinine 2.95 - 1.00 mg/dL 2.84   Sodium 132 - 440 mmol/L 144   Potassium 3.5 - 5.1 mmol/L 4.7   Chloride 98 - 111 mmol/L 106   CO2 22 - 32 mmol/L 30   Calcium 8.9 - 10.3 mg/dL 9.7   Total Protein 6.5 - 8.1 g/dL 6.9   Total Bilirubin 0.3 - 1.2 mg/dL 0.4   Alkaline Phos 38 - 126 U/L 89   AST 15 - 41 U/L 36   ALT 0 - 44 U/L 37     Lab Results  Component Value Date   WBC 6.1 07/09/2021   HGB 13.3 07/09/2021   HCT 40.3 07/09/2021   MCV 93.9 07/09/2021   PLT 219 07/09/2021   NEUTROABS 3.1 07/09/2021    ASSESSMENT & PLAN:  Ductal carcinoma in situ (DCIS) of left breast 06/25/2021:Screening mammogram detected left breast abnormalities. biopsy on 06/25/2021 showed atypical epithelium 0.5 cm mass at 7:00, and DCIS low-grade 1.1 cm mass at 12:00, ER/PR+ (100%).    08/05/21: Left Sup lumpectomy: IG DCIS, Left Inf lumpectomy: IG DCIS, ER/PR Pos   Treatment Plan: 1. adjuvant radiation therapy 09/12/2021-10/09/2021 2. Followed by antiestrogen therapy with tamoxifen 5 years started 10/25/2021   Tamoxifen toxicities: Weight gain: In spite of being extremely physically active with exercise swimming etc.  I discussed with her about reducing the dosage of tamoxifen to 5 mg a day.  Breast cancer surveillance: Mammogram 05/27/2022: [facility]: Benign breast density category B I discussed with her that she will need diagnostic mammograms for 2 years and then it goes to screening mammograms after that.  Return to clinic in 1 year for follow-up    No orders of the defined types were placed in this encounter.  The patient has  a good understanding of the overall plan. she agrees with it. she will call with any problems that may develop before the next visit here. Total time spent: 30 mins including face to face time and time spent for planning, charting and co-ordination of care   Tamsen Meek, MD 01/11/23    I Janan Ridge am acting as a Neurosurgeon for [employer]  I have reviewed the above documentation for accuracy and completeness, and I agree with the above.

## 2023-01-11 ENCOUNTER — Other Ambulatory Visit: Payer: Self-pay

## 2023-01-11 ENCOUNTER — Inpatient Hospital Stay: Payer: Medicare Other | Attending: Hematology and Oncology | Admitting: Hematology and Oncology

## 2023-01-11 VITALS — BP 135/62 | HR 65 | Temp 97.8°F | Resp 18 | Ht 64.0 in | Wt 164.6 lb

## 2023-01-11 DIAGNOSIS — Z79810 Long term (current) use of selective estrogen receptor modulators (SERMs): Secondary | ICD-10-CM | POA: Diagnosis not present

## 2023-01-11 DIAGNOSIS — D0512 Intraductal carcinoma in situ of left breast: Secondary | ICD-10-CM | POA: Diagnosis not present

## 2023-01-11 MED ORDER — TAMOXIFEN CITRATE 10 MG PO TABS: 5.0000 mg | ORAL_TABLET | Freq: Every day | ORAL | 3 refills | Status: DC

## 2023-01-11 NOTE — Assessment & Plan Note (Signed)
06/25/2021:Screening mammogram detected left breast abnormalities. biopsy on 06/25/2021 showed atypical epithelium 0.5 cm mass at 7:00, and DCIS low-grade 1.1 cm mass at 12:00, ER/PR+ (100%).    08/05/21: Left Sup lumpectomy: IG DCIS, Left Inf lumpectomy: IG DCIS, ER/PR Pos   Treatment Plan: 1. adjuvant radiation therapy 09/12/2021-10/09/2021 2. Followed by antiestrogen therapy with tamoxifen 5 years started 10/25/2021   Tamoxifen toxicities:   Breast cancer surveillance: Breast exam 01/11/2023: Benign Mammogram 05/27/2022: [facility]: Benign breast density category B  Return to clinic in 1 year for follow-up

## 2023-02-09 ENCOUNTER — Ambulatory Visit (INDEPENDENT_AMBULATORY_CARE_PROVIDER_SITE_OTHER): Payer: Medicare Other | Admitting: Radiology

## 2023-02-09 ENCOUNTER — Encounter: Payer: Self-pay | Admitting: Radiology

## 2023-02-09 VITALS — BP 124/76 | Ht 63.5 in | Wt 166.0 lb

## 2023-02-09 DIAGNOSIS — Z9189 Other specified personal risk factors, not elsewhere classified: Secondary | ICD-10-CM | POA: Diagnosis not present

## 2023-02-09 DIAGNOSIS — D0512 Intraductal carcinoma in situ of left breast: Secondary | ICD-10-CM

## 2023-02-09 DIAGNOSIS — Z01419 Encounter for gynecological examination (general) (routine) without abnormal findings: Secondary | ICD-10-CM

## 2023-02-09 NOTE — Progress Notes (Signed)
   Whitney Ford April 24, 1956 [id number]   History: Postmenopausal 67 y.o. presents for annual exam. S/p DCIS left breast with lumpectomy. Currently on tamoxifen, doing well. No other gyn concerns.    Gynecologic History Postmenopausal Last Pap: 2023. Results were: normal Last mammogram: 11/23. Results were: normal Last colonoscopy: 2016 DEXA:2023 (managed by PCP)   Obstetric History OB History  Gravida Para Term Preterm AB Living  6 3 3   3 3   SAB IAB Ectopic Multiple Live Births  3       3    # Outcome Date GA Lbr Len/2nd Weight Sex Type Anes PTL Lv  6 Term     F Vag-Spont   LIV  5 Term     F Vag-Spont   LIV  4 SAB           3 SAB           2 SAB           1 Term     F Vag-Spont   LIV     The following portions of the patient's history were reviewed and updated as appropriate: allergies, current medications, past family history, past medical history, past social history, past surgical history, and problem list.  Review of Systems Pertinent items noted in HPI and remainder of comprehensive ROS otherwise negative.  Past medical history, past surgical history, family history and social history were all reviewed and documented in the EPIC chart.  Exam:  Vitals:   02/09/23 1337  BP: 124/76  Weight: 166 lb (75.3 kg)  Height: 5' 3.5" (1.613 m)   Body mass index is 28.94 kg/m.  General appearance:  Normal Thyroid:  Symmetrical, normal in size, without palpable masses or nodularity. Respiratory  Auscultation:  Clear without wheezing or rhonchi Cardiovascular  Auscultation:  Regular rate, without rubs, murmurs or gallops  Edema/varicosities:  Not grossly evident Abdominal  Soft,nontender, without masses, guarding or rebound.  Liver/spleen:  No organomegaly noted  Hernia:  None appreciated  Skin  Inspection:  Grossly normal Breasts: Examined lying and sitting.   Right: Without masses, retractions, nipple discharge or axillary adenopathy.   Left: S/p lumpectomy  Without masses, retractions, nipple discharge or axillary adenopathy. Genitourinary   Inguinal/mons:  Normal without inguinal adenopathy  External genitalia:  Normal appearing vulva with no masses, tenderness, or lesions  BUS/Urethra/Skene's glands:  Normal  Vagina:  Normal appearing with normal color and discharge, no lesions. Atrophy: mild   Cervix:  Normal appearing without discharge or lesions  Uterus:  Normal in size, shape and contour.  Midline and mobile, nontender  Adnexa/parametria:     Rt: Normal in size, without masses or tenderness.   Lt: Normal in size, without masses or tenderness.  Anus and perineum: Normal    Whitney Ford, CMA present for exam  Assessment/Plan:   1. Well woman exam with routine gynecological exam Pap due 2028  2. Ductal carcinoma in situ (DCIS) of left breast S/p lumpectomy  On tamoxifen, followed by oncology    Discussed SBE, colonoscopy and DEXA screening as directed. Recommend of exercise weekly, including weight bearing exercise. Encouraged the use of seatbelts and sunscreen.  Return in 1 year for annual or sooner prn.  Whitney Ford WHNP-BC, 2:20 PM 02/09/2023

## 2023-06-15 DIAGNOSIS — Z853 Personal history of malignant neoplasm of breast: Secondary | ICD-10-CM | POA: Diagnosis not present

## 2023-06-16 ENCOUNTER — Encounter: Payer: Self-pay | Admitting: Hematology and Oncology

## 2023-08-05 NOTE — Progress Notes (Signed)
 Chief Complaint:   Chief Complaint  Patient presents with  . Follow-up    Subjective  HPI History of Present Illness The patient, a 68 year old female with a history of ER and PR positive ductal carcinoma in situ of the left breast, underwent a lumpectomy in January 2023 followed by radiotherapy. She is currently on tamoxifen  for ongoing management. Her last mammogram in November 2024 was benign with a B density.  The patient reports doing well overall. She is currently on a 5mg  dose of tamoxifen , which she tolerates better than the previous 10mg  dose. On the higher dose, she experienced hot flashes and weight gain, which have since ceased. She denies any new lumps or discharge. She remains active and has a follow-up appointment with her oncologist in June.   Patient Active Problem List  Diagnosis  . Hyperlipidemia  . Ductal carcinoma in situ (DCIS) of left breast    Outpatient Medications Prior to Visit  Medication Sig Dispense Refill  . atorvastatin  (LIPITOR) 10 MG tablet Take 1 tablet (10 mg total) by mouth once daily    . calcium  carbonate (TUMS E-X) 300 mg (750 mg) chewable tablet Take 1 tablet (750 mg of salt total) by mouth every 2 (two) hours as needed    . cetirizine (ZYRTEC) 10 mg capsule Take 1 capsule (10 mg total) by mouth once daily    . cholecalciferol (VITAMIN D3) 1000 unit tablet Take by mouth    . multivitamin tablet Take 1 tablet by mouth once daily    . olmesartan (BENICAR) 20 MG tablet Take 1 tablet (20 mg total) by mouth once daily    . omega-3 acid ethyl esters (LOVAZA) 1 gram capsule Take 2 capsules (2 g total) by mouth 2 (two) times daily    . tamoxifen  (NOLVADEX ) 10 MG tablet Take 10 mg by mouth once daily     No facility-administered medications prior to visit.      Objective  Vitals:   08/05/23 1408  PainSc: 0-No pain  PainLoc: Breast   There is no height or weight on file to calculate BMI.  Home Vitals:     Physical Exam Vitals  reviewed.  Constitutional:      Appearance: Normal appearance.  Chest:  Breasts:    Right: No inverted nipple, mass or nipple discharge.     Left: No inverted nipple, mass or nipple discharge.  Lymphadenopathy:     Upper Body:     Right upper body: No supraclavicular or axillary adenopathy.     Left upper body: No supraclavicular or axillary adenopathy.  Neurological:     Mental Status: She is alert.     Results        Assessment/Plan:   Assessment & Plan Breast Cancer, ER/PR positive DCIS, status post lumpectomy and radiotherapy No new lumps or discharge. No concerning findings on physical exam. Last mammogram on 06/15/2023 was benign. Tamoxifen  5mg  daily is well tolerated and has reduced hot flashes and weight gain compared to 10mg  daily. -Continue Tamoxifen  5mg  daily. -Next mammogram due November 2025. -Follow-up with Dr. Odean in June 2025. -Return to clinic in 1 year, sooner if any new concerns arise.  Diagnoses and all orders for this visit:  Ductal carcinoma in situ (DCIS) of left breast    Return in about 1 year (around 08/04/2024).     Future Appointments     Date/Time Provider Department Center Visit Type   08/05/2023 2:20 PM (Arrive by 2:05 PM) Ebbie Donnice Dunnings, MD [facility] LONG TERM FOLLOW UP       There are no Patient Instructions on file for this visit.  An after visit summary was provided for the patient either in written format (printed) or through MyChart.  This note has been created using automated tools and reviewed for accuracy by MATTHEW CHARLES WAKEFIELD.

## 2023-09-28 DIAGNOSIS — R262 Difficulty in walking, not elsewhere classified: Secondary | ICD-10-CM | POA: Diagnosis not present

## 2023-09-28 DIAGNOSIS — M25551 Pain in right hip: Secondary | ICD-10-CM | POA: Diagnosis not present

## 2023-09-28 DIAGNOSIS — R531 Weakness: Secondary | ICD-10-CM | POA: Diagnosis not present

## 2023-10-07 ENCOUNTER — Ambulatory Visit: Admitting: Nurse Practitioner

## 2023-10-07 ENCOUNTER — Encounter: Payer: Self-pay | Admitting: Nurse Practitioner

## 2023-10-07 VITALS — BP 136/80 | HR 66

## 2023-10-07 DIAGNOSIS — N950 Postmenopausal bleeding: Secondary | ICD-10-CM

## 2023-10-07 DIAGNOSIS — Z79810 Long term (current) use of selective estrogen receptor modulators (SERMs): Secondary | ICD-10-CM

## 2023-10-07 NOTE — Progress Notes (Signed)
   Acute Office Visit  Subjective:    Patient ID: Whitney Ford, female    DOB: 05-03-1956, 68 y.o.   MRN: [id number]   HPI 68 y.o. presents today for PMB. Had episode of bleeding last night. Filled a pad in about 3 hours. Bleeding is very light today. H/O left ER+/PR+ DCIS January 2023. On Tamoxifen since June 2023.   No LMP recorded (lmp unknown). Patient is postmenopausal.    Review of Systems  Constitutional: Negative.   Genitourinary:  Positive for vaginal bleeding.       Objective:    Physical Exam Constitutional:      Appearance: Normal appearance.   GU: Not indicated  BP 136/80 (BP Location: Left Arm, Patient Position: Sitting, Cuff Size: Small)   Pulse 66   LMP  (LMP Unknown)   SpO2 99%  Wt Readings from Last 3 Encounters:  02/09/23 166 lb (75.3 kg)  01/11/23 164 lb 9.6 oz (74.7 kg)  01/28/22 161 lb (73 kg)        Assessment & Plan:   Problem List Items Addressed This Visit   None Visit Diagnoses       Postmenopausal bleeding    -  Primary   Relevant Orders   US PELVIS TRANSVAGINAL NON-OB (TV ONLY)     Long-term current use of tamoxifen       Relevant Orders   US PELVIS TRANSVAGINAL NON-OB (TV ONLY)      Plan: Will schedule ultrasound with possible EMB. Bleeding is very light today.     Olivia Mackie DNP, 1:41 PM 10/07/2023

## 2023-10-13 DIAGNOSIS — R262 Difficulty in walking, not elsewhere classified: Secondary | ICD-10-CM | POA: Diagnosis not present

## 2023-10-13 DIAGNOSIS — R531 Weakness: Secondary | ICD-10-CM | POA: Diagnosis not present

## 2023-10-13 DIAGNOSIS — M25551 Pain in right hip: Secondary | ICD-10-CM | POA: Diagnosis not present

## 2023-10-26 ENCOUNTER — Other Ambulatory Visit: Payer: Self-pay | Admitting: Radiology

## 2023-10-26 DIAGNOSIS — N950 Postmenopausal bleeding: Secondary | ICD-10-CM

## 2023-10-26 DIAGNOSIS — Z79810 Long term (current) use of selective estrogen receptor modulators (SERMs): Secondary | ICD-10-CM

## 2023-10-28 ENCOUNTER — Ambulatory Visit (INDEPENDENT_AMBULATORY_CARE_PROVIDER_SITE_OTHER)

## 2023-10-28 ENCOUNTER — Encounter: Payer: Self-pay | Admitting: Radiology

## 2023-10-28 ENCOUNTER — Other Ambulatory Visit (HOSPITAL_COMMUNITY)
Admission: RE | Admit: 2023-10-28 | Discharge: 2023-10-28 | Disposition: A | Discharge status: Home / Self Care | Source: Ambulatory Visit | Attending: Radiology | Admitting: Radiology

## 2023-10-28 ENCOUNTER — Ambulatory Visit (INDEPENDENT_AMBULATORY_CARE_PROVIDER_SITE_OTHER): Admitting: Radiology

## 2023-10-28 VITALS — BP 130/82

## 2023-10-28 DIAGNOSIS — N858 Other specified noninflammatory disorders of uterus: Secondary | ICD-10-CM | POA: Diagnosis not present

## 2023-10-28 DIAGNOSIS — Z79810 Long term (current) use of selective estrogen receptor modulators (SERMs): Secondary | ICD-10-CM

## 2023-10-28 DIAGNOSIS — Z9229 Personal history of other drug therapy: Secondary | ICD-10-CM | POA: Insufficient documentation

## 2023-10-28 DIAGNOSIS — N950 Postmenopausal bleeding: Secondary | ICD-10-CM | POA: Diagnosis not present

## 2023-10-28 DIAGNOSIS — R9389 Abnormal findings on diagnostic imaging of other specified body structures: Secondary | ICD-10-CM

## 2023-10-28 NOTE — Progress Notes (Signed)
      ENDOMETRIAL BIOPSY       Whitney Ford 68 y.o. presents for endometrial biopsy. Reason for biopsy: thickened endometrium, PMB on tamoxifen.  The indications for endometrial biopsy were reviewed.    Risks of the biopsy including cramping, bleeding, infection, uterine perforation, inadequate specimen and need for additional procedures  were discussed.  The patient states she understands and agrees to undergo procedure today. Consent obtained.  Time out was performed.   Past Medical History:  Diagnosis Date   Bradycardia, sinus    Breast cancer (HCC) 06/26/2021   Dry eyes    Elevated LDL cholesterol level 12/07/2019   Estrogen deficiency    Fibromuscular dysplasia (HCC)    Hives    Irregular heartbeat    PONV (postoperative nausea and vomiting)    Renovascular hypertension     Current Outpatient Medications on File Prior to Visit  Medication Sig Dispense Refill   Calcium Carbonate 500 MG CHEW at bedtime. 2 chews at bedtime nightly     cetirizine (ZYRTEC) 10 MG tablet Take 10 mg by mouth daily.     cholecalciferol (VITAMIN D) 25 MCG (1000 UNIT) tablet Take by mouth daily.     Multiple Vitamin (MULTIVITAMIN) tablet Take 1 tablet by mouth daily.     olmesartan (BENICAR) 20 MG tablet Take 20 mg by mouth daily.     Omega-3 Fatty Acids (FISH OIL) 1200 MG CAPS Take by mouth.     tamoxifen (NOLVADEX) 10 MG tablet Take 0.5 tablets (5 mg total) by mouth daily. 90 tablet 3   No current facility-administered medications on file prior to visit.      Narrative & Impression Indication:   Vaginal ultrasound   Anteflexed size and shape 8.76 x 5.93 x 4.91 cm No myometrial masses   Thick, cystic endometrium 29.95 mm avascular   Both ovaries atrophic   No adnexal masses   No free fluid   Impression:thickened endometrium on Tamoxifen       Exam Ended: 10/28/23 09:04 Last Resulted: 10/28/23 09:28     Procedure Speculum inserted into the vagina, cervix visualized and was  prepped with Betadine. A single-toothed tenaculum was placed on the anterior lip of the cervix to stabilize it.  The 3 mm pipelle was introduced into the endometrial cavity without difficulty to a depth of 9cm, suction initiated and a moderate amount of tissue was obtained and sent to pathology.  The instruments were removed from the patient's vagina.  Minimal bleeding from the cervix was noted.  The patient tolerated the procedure well.   Whitney Ford, CMA present for exam  Assessment/Plan: 1. Thickened endometrium (Primary) - Surgical pathology( [facility]/ [facility])  2. Hx of tamoxifen therapy - Surgical pathology( [facility]/ [facility])   Routine post-procedure instructions were given to the patient.   Will contact with results of biopsy.    Whitney Ford, [facility]

## 2023-10-29 ENCOUNTER — Other Ambulatory Visit: Payer: Self-pay

## 2023-10-29 DIAGNOSIS — R9389 Abnormal findings on diagnostic imaging of other specified body structures: Secondary | ICD-10-CM

## 2023-10-29 LAB — SURGICAL PATHOLOGY

## 2023-11-30 DIAGNOSIS — D0512 Intraductal carcinoma in situ of left breast: Secondary | ICD-10-CM | POA: Diagnosis not present

## 2023-11-30 DIAGNOSIS — I150 Renovascular hypertension: Secondary | ICD-10-CM | POA: Diagnosis not present

## 2023-11-30 DIAGNOSIS — I773 Arterial fibromuscular dysplasia: Secondary | ICD-10-CM | POA: Diagnosis not present

## 2023-11-30 DIAGNOSIS — Z1331 Encounter for screening for depression: Secondary | ICD-10-CM | POA: Diagnosis not present

## 2023-11-30 DIAGNOSIS — R001 Bradycardia, unspecified: Secondary | ICD-10-CM | POA: Diagnosis not present

## 2023-11-30 DIAGNOSIS — M85852 Other specified disorders of bone density and structure, left thigh: Secondary | ICD-10-CM | POA: Diagnosis not present

## 2023-11-30 DIAGNOSIS — Z0000 Encounter for general adult medical examination without abnormal findings: Secondary | ICD-10-CM | POA: Diagnosis not present

## 2023-11-30 DIAGNOSIS — E7800 Pure hypercholesterolemia, unspecified: Secondary | ICD-10-CM | POA: Diagnosis not present

## 2023-11-30 DIAGNOSIS — M545 Low back pain, unspecified: Secondary | ICD-10-CM | POA: Diagnosis not present

## 2023-12-01 ENCOUNTER — Other Ambulatory Visit: Payer: Self-pay | Admitting: Obstetrics and Gynecology

## 2023-12-01 DIAGNOSIS — R9389 Abnormal findings on diagnostic imaging of other specified body structures: Secondary | ICD-10-CM

## 2023-12-02 ENCOUNTER — Encounter: Payer: Self-pay | Admitting: Obstetrics and Gynecology

## 2023-12-02 ENCOUNTER — Ambulatory Visit (INDEPENDENT_AMBULATORY_CARE_PROVIDER_SITE_OTHER): Admitting: Obstetrics and Gynecology

## 2023-12-02 ENCOUNTER — Ambulatory Visit

## 2023-12-02 VITALS — BP 96/64 | HR 63 | Temp 98.3°F | Wt 163.0 lb

## 2023-12-02 DIAGNOSIS — N840 Polyp of corpus uteri: Secondary | ICD-10-CM | POA: Insufficient documentation

## 2023-12-02 DIAGNOSIS — R9389 Abnormal findings on diagnostic imaging of other specified body structures: Secondary | ICD-10-CM | POA: Diagnosis not present

## 2023-12-02 DIAGNOSIS — N950 Postmenopausal bleeding: Secondary | ICD-10-CM | POA: Diagnosis not present

## 2023-12-02 NOTE — Progress Notes (Signed)
 68 y.o. [id number] female with  H/O left ER+/PR+ DCIS (January 2023, left lumpectomy, RT, On Tamoxifen  since June 2023), PMB, PONV, HTN, renal artery stenosis, HLD, visual aura without migraine here for SIS. Married.  No LMP recorded (lmp unknown). Patient is postmenopausal.   Had bleeding for ~2wk, no PMB since last visit weeks. 1 episode of HVB in March. No cramping. No CP or SOB. Had a D&C for missed ab many years ago. Planning tamoxifen  for 5 years.  Last mammogram: 06/15/2023 BI-RADS 2, density B  GYN HISTORY: No significant history  OB History  Gravida Para Term Preterm AB Living  6 3 3  3 3   SAB IAB Ectopic Multiple Live Births  3    3    # Outcome Date GA Lbr Len/2nd Weight Sex Type Anes PTL Lv  6 Term     F Vag-Spont   LIV  5 Term     F Vag-Spont   LIV  4 SAB           3 SAB           2 SAB           1 Term     F Vag-Spont   LIV    Past Medical History:  Diagnosis Date   Bradycardia, sinus    Breast cancer (HCC) 06/26/2021   Dry eyes    Elevated LDL cholesterol level 12/07/2019   Estrogen deficiency    Fibromuscular dysplasia (HCC)    Hives    Irregular heartbeat    PONV (postoperative nausea and vomiting)    Renovascular hypertension     Past Surgical History:  Procedure Laterality Date   BREAST BIOPSY Right 07/17/2021   MRI Bx   BREAST LUMPECTOMY WITH RADIOACTIVE SEED LOCALIZATION Left 08/05/2021   Procedure: LEFT BREAST LUMPECTOMY WITH RADIOACTIVE SEED LOCALIZATION;  Surgeon: Enid Harry, MD;  Location: [facility];  Service: General;  Laterality: Left;   BTL     CHILDBIRTH     RADIOACTIVE SEED GUIDED EXCISIONAL BREAST BIOPSY Left 08/05/2021   Procedure: RADIOACTIVE SEED GUIDED EXCISIONAL LEFT BREAST BIOPSY;  Surgeon: Enid Harry, MD;  Location: [facility];  Service: General;  Laterality: Left;    Current Outpatient Medications on File Prior to Visit  Medication Sig Dispense Refill   Calcium  Carbonate 500  MG CHEW at bedtime. 2 chews at bedtime nightly     cetirizine (ZYRTEC) 10 MG tablet Take 10 mg by mouth daily.     cholecalciferol (VITAMIN D) 25 MCG (1000 UNIT) tablet Take by mouth daily.     Multiple Vitamin (MULTIVITAMIN) tablet Take 1 tablet by mouth daily.     olmesartan (BENICAR) 20 MG tablet Take 20 mg by mouth daily.     Omega-3 Fatty Acids (FISH OIL) 1200 MG CAPS Take by mouth.     tamoxifen  (NOLVADEX ) 10 MG tablet Take 0.5 tablets (5 mg total) by mouth daily. 90 tablet 3   No current facility-administered medications on file prior to visit.    Allergies  Allergen Reactions   Atorvastatin  Dermatitis   Other Rash    Chloraprep one step      PE Today's Vitals   12/02/23 0950  BP: 96/64  Pulse: 63  Temp: 98.3 F (36.8 C)  TempSrc: Oral  SpO2: 98%  Weight: 163 lb (73.9 kg)   Body mass index is 28.42 kg/m.  Physical Exam Vitals reviewed.  Constitutional:      General: She  is not in acute distress.    Appearance: Normal appearance.  HENT:     Head: Normocephalic and atraumatic.     Nose: Nose normal.  Eyes:     Extraocular Movements: Extraocular movements intact.     Conjunctiva/sclera: Conjunctivae normal.  Cardiovascular:     Rate and Rhythm: Normal rate and regular rhythm.     Heart sounds: No murmur heard.    No friction rub. No gallop.  Pulmonary:     Effort: Pulmonary effort is normal. No respiratory distress.     Breath sounds: Normal breath sounds. No stridor. No wheezing, rhonchi or rales.  Musculoskeletal:        General: Normal range of motion.     Cervical back: Normal range of motion.  Neurological:     General: No focal deficit present.     Mental Status: She is alert.  Psychiatric:        Mood and Affect: Mood normal.        Behavior: Behavior normal.     Indications: Postmenopausal bleeding, thickened endometrium  Findings:   Procedure - sonohysterogram Consent performed. Speculum placed in vagina. Sterile prep of cervix with   betadine Cannula placed inside endometrial cavity without difficulty. Speculum removed. Uterus: anteverted Sterile saline injected.      2  filling defects noted. 61mmx19mm and 16x15mm. Endometrial thickness: 3.54mm Ovaries were not visualized. Cannula removed. No complication.   Impression:  2 endometrial polyps.  Romaine Closs, MD    Assessment and Plan:        Endometrial polyp Assessment & Plan: Reviewed TVUS and SIS with patient. Uncomplicated procedure. Plan for hysteroscopy, dilation and curettage, polypectomy.  Discussed outpatient procedure. Reviewed that  recovery is usually 1-2 days. Risks including infections, bleeding, and damage to surrounding organs reviewed. Recommend NPO prior to midnight and reviewed medication to take on day of surgery. Dicussed use of NSAIDS as needed for pain postoperatively.  Preop checklist: Antibiotics: none DVT ppx: SCDs Postop visit: 1 week Additional clearance: Medical due to hypertension and renal artery stenosis  Orders: -     Ambulatory Referral For Surgery Scheduling  Postmenopausal bleeding -     Ambulatory Referral For Surgery Scheduling    Romaine Closs, MD

## 2023-12-02 NOTE — Assessment & Plan Note (Signed)
 Reviewed TVUS and SIS with patient. Uncomplicated procedure. Plan for hysteroscopy, dilation and curettage, polypectomy.  Discussed outpatient procedure. Reviewed that  recovery is usually 1-2 days. Risks including infections, bleeding, and damage to surrounding organs reviewed. Recommend NPO prior to midnight and reviewed medication to take on day of surgery. Dicussed use of NSAIDS as needed for pain postoperatively.  Preop checklist: Antibiotics: none DVT ppx: SCDs Postop visit: 1 week Additional clearance: Medical due to hypertension and renal artery stenosis

## 2023-12-09 DIAGNOSIS — Z8262 Family history of osteoporosis: Secondary | ICD-10-CM | POA: Diagnosis not present

## 2023-12-09 DIAGNOSIS — M8588 Other specified disorders of bone density and structure, other site: Secondary | ICD-10-CM | POA: Diagnosis not present

## 2023-12-13 DIAGNOSIS — H35373 Puckering of macula, bilateral: Secondary | ICD-10-CM | POA: Diagnosis not present

## 2023-12-17 DIAGNOSIS — Z1283 Encounter for screening for malignant neoplasm of skin: Secondary | ICD-10-CM | POA: Diagnosis not present

## 2023-12-17 DIAGNOSIS — D225 Melanocytic nevi of trunk: Secondary | ICD-10-CM | POA: Diagnosis not present

## 2023-12-27 ENCOUNTER — Other Ambulatory Visit: Payer: Self-pay | Admitting: Hematology and Oncology

## 2024-01-11 ENCOUNTER — Ambulatory Visit: Admitting: Hematology and Oncology

## 2024-01-18 ENCOUNTER — Inpatient Hospital Stay: Attending: Hematology and Oncology | Admitting: Hematology and Oncology

## 2024-01-18 VITALS — BP 134/78 | HR 74 | Temp 97.5°F | Resp 17 | Wt 168.1 lb

## 2024-01-18 DIAGNOSIS — Z923 Personal history of irradiation: Secondary | ICD-10-CM | POA: Insufficient documentation

## 2024-01-18 DIAGNOSIS — D0512 Intraductal carcinoma in situ of left breast: Secondary | ICD-10-CM | POA: Diagnosis not present

## 2024-01-18 DIAGNOSIS — Z170 Estrogen receptor positive status [ER+]: Secondary | ICD-10-CM | POA: Insufficient documentation

## 2024-01-18 DIAGNOSIS — Z79810 Long term (current) use of selective estrogen receptor modulators (SERMs): Secondary | ICD-10-CM | POA: Insufficient documentation

## 2024-01-18 DIAGNOSIS — Z1721 Progesterone receptor positive status: Secondary | ICD-10-CM | POA: Diagnosis not present

## 2024-01-18 NOTE — Progress Notes (Signed)
 Patient Care Team: Seabron Lenis, MD as PCP - General (Family Medicine) Shlomo Wilbert SAUNDERS, MD as PCP - Cardiology (Cardiology) Ebbie Cough, MD as Consulting Physician (General Surgery) Odean Potts, MD as Consulting Physician (Hematology and Oncology) Dewey Rush, MD as Consulting Physician (Radiation Oncology)  DIAGNOSIS:  Encounter Diagnosis  Name Primary?   Ductal carcinoma in situ (DCIS) of left breast Yes    SUMMARY OF ONCOLOGIC HISTORY: Oncology History  Ductal carcinoma in situ (DCIS) of left breast  06/25/2021 Initial Diagnosis   Screening mammogram detected left breast abnormalities. biopsy on 06/25/2021 showed atypical epithelium 0.5 cm mass at 7:00, and DCIS low-grade 1.1 cm mass at 12:00, ER/PR+ (100%).     Genetic Testing   [facility] CancerNext-Expanded Panel+RNA is Negative. Report date is 07/24/2021.  The CancerNext-Expanded gene panel offered by [facility] and includes sequencing, rearrangement, and RNA analysis for the following 77 genes: AIP, ALK, APC, ATM, AXIN2, BAP1, BARD1, BLM, BMPR1A, BRCA1, BRCA2, BRIP1, CDC73, CDH1, CDK4, CDKN1B, CDKN2A, CHEK2, CTNNA1, DICER1, FANCC, FH, FLCN, GALNT12, KIF1B, LZTR1, MAX, MEN1, MET, MLH1, MSH2, MSH3, MSH6, MUTYH, NBN, NF1, NF2, NTHL1, PALB2, PHOX2B, PMS2, POT1, PRKAR1A, PTCH1, PTEN, RAD51C, RAD51D, RB1, RECQL, RET, SDHA, SDHAF2, SDHB, SDHC, SDHD, SMAD4, SMARCA4, SMARCB1, SMARCE1, STK11, SUFU, TMEM127, TP53, TSC1, TSC2, VHL and XRCC2 (sequencing and deletion/duplication); EGFR, EGLN1, HOXB13, KIT, MITF, PDGFRA, POLD1, and POLE (sequencing only); EPCAM and GREM1 (deletion/duplication only).    08/05/2021 Surgery   Left inferior lumpectomy: 0.3 cm DCIS intermediate grade arising a complex sclerosing lesion extends to the inked medial margin Left superior lumpectomy: DCIS intermediate grade as arising in a complex sclerosing lesion, margins negative Medial margin excision: Radial scar with U DH, incidental papilloma Previous  ER/PR 100% positive   09/11/2021 - 10/08/2021 Radiation Therapy   Site Technique Total Dose (Gy) Dose per Fx (Gy) Completed Fx Beam Energies  Breast, Left: Breast_L 3D 42.56/42.56 2.66 16/16 6XFFF  Breast, Left: Breast_L_Bst 3D 10/10 2.5 4/4 6X, 10X     10/25/2021 -  Anti-estrogen oral therapy   Tamoxifen  x 5 years     CHIEF COMPLIANT: Follow-up on tamoxifen  therapy  HISTORY OF PRESENT ILLNESS:   History of Present Illness Whitney Ford is a 68 year old female with ductal carcinoma in situ (DCIS) who presents with breakthrough uterine bleeding.  She has experienced breakthrough uterine bleeding since February, leading to the diagnosis of uterine polyps. She is awaiting insurance approval and scheduling for polyp removal and will see her gynecologist next month for further evaluation.  She has been on tamoxifen  since April 2023 as part of her DCIS treatment. Tamoxifen  is associated with an increased risk of uterine abnormalities, including polyps. She remains compliant with her medication regimen.  She undergoes annual mammograms every November as part of her DCIS management.     ALLERGIES:  is allergic to atorvastatin  and other.  MEDICATIONS:  Current Outpatient Medications  Medication Sig Dispense Refill   Calcium  Carbonate 500 MG CHEW at bedtime. 2 chews at bedtime nightly     cetirizine (ZYRTEC) 10 MG tablet Take 10 mg by mouth daily.     cholecalciferol (VITAMIN D) 25 MCG (1000 UNIT) tablet Take by mouth daily.     Multiple Vitamin (MULTIVITAMIN) tablet Take 1 tablet by mouth daily.     olmesartan (BENICAR) 20 MG tablet Take 20 mg by mouth daily.     Omega-3 Fatty Acids (FISH OIL) 1200 MG CAPS Take by mouth.     tamoxifen  (NOLVADEX ) 10  MG tablet TAKE 1 TABLET BY MOUTH DAILY 90 tablet 3   No current facility-administered medications for this visit.    PHYSICAL EXAMINATION: ECOG PERFORMANCE STATUS: 1 - Symptomatic but completely ambulatory  Vitals:   01/18/24 1352   BP: 134/78  Pulse: 74  Resp: 17  Temp: (!) 97.5 F (36.4 C)  SpO2: 99%   Filed Weights   01/18/24 1352  Weight: 168 lb 1.6 oz (76.2 kg)      LABORATORY DATA:  I have reviewed the data as listed    Latest Ref Rng & Units 07/09/2021   12:20 PM  CMP  Glucose 70 - 99 mg/dL 82   BUN 8 - 23 mg/dL 16   Creatinine 9.55 - 1.00 mg/dL 9.17   Sodium 864 - 854 mmol/L 144   Potassium 3.5 - 5.1 mmol/L 4.7   Chloride 98 - 111 mmol/L 106   CO2 22 - 32 mmol/L 30   Calcium  8.9 - 10.3 mg/dL 9.7   Total Protein 6.5 - 8.1 g/dL 6.9   Total Bilirubin 0.3 - 1.2 mg/dL 0.4   Alkaline Phos 38 - 126 U/L 89   AST 15 - 41 U/L 36   ALT 0 - 44 U/L 37     Lab Results  Component Value Date   WBC 6.1 07/09/2021   HGB 13.3 07/09/2021   HCT 40.3 07/09/2021   MCV 93.9 07/09/2021   PLT 219 07/09/2021   NEUTROABS 3.1 07/09/2021    ASSESSMENT & PLAN:  Ductal carcinoma in situ (DCIS) of left breast 06/25/2021:Screening mammogram detected left breast abnormalities. biopsy on 06/25/2021 showed atypical epithelium 0.5 cm mass at 7:00, and DCIS low-grade 1.1 cm mass at 12:00, ER/PR+ (100%).    08/05/21: Left Sup lumpectomy: IG DCIS, Left Inf lumpectomy: IG DCIS, ER/PR Pos   Treatment Plan: 1. adjuvant radiation therapy 09/12/2021-10/09/2021 2. Followed by antiestrogen therapy with tamoxifen  5 years started 10/25/2021 discontinued 01/18/2024 because of uterine bleeding/polyps   Tamoxifen  toxicities: Weight gain:   Endometrial polyps 12/02/2023: I recommended discontinuation of tamoxifen  therapy: Patient is likely to need polypectomy.  She is working with her gynecologist on appointments. We discussed alternate options like anastrozole or letrozole but decided not to pursue any further antiestrogen treatments at this time.   Breast cancer surveillance: Mammogram 05/28/2023: [facility]: Benign breast density category B     Return to clinic in 1 year for follow-up with Morna for long-term survivorship.     No  orders of the defined types were placed in this encounter.  The patient has a good understanding of the overall plan. she agrees with it. she will call with any problems that may develop before the next visit here. Total time spent: 30 mins including face to face time and time spent for planning, charting and co-ordination of care   Naomi MARLA Chad, MD 01/18/24

## 2024-01-18 NOTE — Assessment & Plan Note (Signed)
 06/25/2021:Screening mammogram detected left breast abnormalities. biopsy on 06/25/2021 showed atypical epithelium 0.5 cm mass at 7:00, and DCIS low-grade 1.1 cm mass at 12:00, ER/PR+ (100%).    08/05/21: Left Sup lumpectomy: IG DCIS, Left Inf lumpectomy: IG DCIS, ER/PR Pos   Treatment Plan: 1. adjuvant radiation therapy 09/12/2021-10/09/2021 2. Followed by antiestrogen therapy with tamoxifen  5 years started 10/25/2021   Tamoxifen  toxicities: Weight gain: In spite of being extremely physically active with exercise swimming etc.  I discussed with her about reducing the dosage of tamoxifen  to 5 mg a day. Endometrial polyps 12/02/2023   Breast cancer surveillance: Mammogram 05/27/2022: [facility]: Benign breast density category B I discussed with her that she will need diagnostic mammograms for 2 years and then it goes to screening mammograms after that.   Return to clinic in 1 year for follow-up

## 2024-02-07 ENCOUNTER — Encounter: Payer: Self-pay | Admitting: *Deleted

## 2024-02-08 ENCOUNTER — Encounter (HOSPITAL_COMMUNITY): Payer: Self-pay | Admitting: Obstetrics and Gynecology

## 2024-02-08 NOTE — Progress Notes (Signed)
 Spoke w/ via phone for pre-op interview--- Jan Lab needs dos----   EKG and BMP per anesthesia. Surgeon orders requested 02/08/24      Lab results------ COVID test -----patient states asymptomatic no test needed Arrive at -------1400 NPO after MN NO Solid Food.  Clear liquids from MN until---1300 Pre-Surgery Ensure or G2:  Med rec completed Medications to take morning of surgery -----NONE Diabetic medication -----  GLP1 agonist last dose: GLP1 instructions:  Patient instructed no nail polish to be worn day of surgery Patient instructed to bring photo id and insurance card day of surgery Patient aware to have Driver (ride ) / caregiver    for 24 hours after surgery - Husband Bebe Cobia Patient Special Instructions ----- Shower with antibacterial soap. Pre-Op special Instructions -----  Patient verbalized understanding of instructions that were given at this phone interview. Patient denies chest pain, sob, fever, cough at the interview.

## 2024-02-09 ENCOUNTER — Encounter: Payer: Self-pay | Admitting: Radiology

## 2024-02-09 ENCOUNTER — Encounter: Admitting: Radiology

## 2024-02-09 NOTE — Progress Notes (Signed)
 Erroneous encounter

## 2024-02-11 NOTE — H&P (View-Only) (Signed)
 68 y.o. [id number] female with PMB, endometrial polyps, H/O left ER+/PR+ DCIS (January 2023, left lumpectomy, RT, On Tamoxifen  since June 2023), PONV, HTN, renal artery stenosis, HLD, visual aura without migraine here for preop: Diagnostic hysteroscopy, dilation and curettage, polypectomy scheduled for 02/15/2024.  Married.  No LMP recorded (lmp unknown). Patient is postmenopausal.   At 12/02/23 appt, she noted: 1 episode of HVB in March. Had bleeding for ~2wk, no PMB since last visit weeks. No cramping. Had a D&C for missed ab many years ago. Started tamoxifen  June 2023 and is planning to use tamoxifen  for 5 years.  No CP or SOB today. Doing well.  01/28/2022 Pap: NIL  10/28/2023 pathology: A. ENDOMETRIUM, BIOPSY:  - Benign inactive endometrium  - Negative for hyperplasia or malignancy   12/02/2023 TV US /SIS: Uterus: anteverted Sterile saline injected.      2  filling defects noted. 45mmx19mm and 16x79mm. Endometrial thickness: 3.68mm Ovaries were not visualized. Cannula removed. No complication.   Impression:  2 endometrial polyps.  GYN HISTORY: No significant history  OB History  Gravida Para Term Preterm AB Living  6 3 3  3 3   SAB IAB Ectopic Multiple Live Births  3    3    # Outcome Date GA Lbr Len/2nd Weight Sex Type Anes PTL Lv  6 Term     F Vag-Spont   LIV  5 Term     F Vag-Spont   LIV  4 SAB           3 SAB           2 SAB           1 Term     F Vag-Spont   LIV    Past Medical History:  Diagnosis Date   Bradycardia, sinus    Breast cancer (HCC) 06/26/2021   Dry eyes    Elevated LDL cholesterol level 12/07/2019   Estrogen deficiency    Fibromuscular dysplasia (HCC)    Hives    Irregular heartbeat    PONV (postoperative nausea and vomiting)    Renovascular hypertension     Past Surgical History:  Procedure Laterality Date   BREAST BIOPSY Right 07/17/2021   MRI Bx   BREAST LUMPECTOMY WITH RADIOACTIVE SEED LOCALIZATION Left 08/05/2021   Procedure: LEFT  BREAST LUMPECTOMY WITH RADIOACTIVE SEED LOCALIZATION;  Surgeon: Ebbie Cough, MD;  Location: [facility];  Service: General;  Laterality: Left;   BTL     CHILDBIRTH     RADIOACTIVE SEED GUIDED EXCISIONAL BREAST BIOPSY Left 08/05/2021   Procedure: RADIOACTIVE SEED GUIDED EXCISIONAL LEFT BREAST BIOPSY;  Surgeon: Ebbie Cough, MD;  Location:  [facility];  Service: General;  Laterality: Left;    Current Outpatient Medications on File Prior to Visit  Medication Sig Dispense Refill   calcium  citrate (CALCITRATE - DOSED IN MG ELEMENTAL CALCIUM ) 950 (200 Ca) MG tablet Take 200 mg of elemental calcium  by mouth daily.     cetirizine (ZYRTEC) 10 MG tablet Take 10 mg by mouth daily.     cholecalciferol (VITAMIN D) 25 MCG (1000 UNIT) tablet Take by mouth daily.     Multiple Vitamin (MULTIVITAMIN) tablet Take 1 tablet by mouth daily.     olmesartan (BENICAR) 20 MG tablet Take 20 mg by mouth daily.     Omega-3 Fatty Acids (FISH OIL) 1200 MG CAPS Take by mouth.     triamcinolone cream (KENALOG) 0.1 % SMARTSIG:Topical 1-2 Times Daily PRN  No current facility-administered medications on file prior to visit.    Allergies  Allergen Reactions   Atorvastatin  Dermatitis   Other Rash    Chloraprep one step      PE Today's Vitals   02/14/24 0907  BP: (!) 138/90  Pulse: (!) 51  Temp: 98 F (36.7 C)  TempSrc: Oral  SpO2: 98%  Weight: 165 lb 12.8 oz (75.2 kg)    Body mass index is 28.91 kg/m.  Physical Exam Vitals reviewed.  Constitutional:      General: She is not in acute distress.    Appearance: Normal appearance.  HENT:     Head: Normocephalic and atraumatic.     Nose: Nose normal.  Eyes:     Extraocular Movements: Extraocular movements intact.     Conjunctiva/sclera: Conjunctivae normal.  Cardiovascular:     Rate and Rhythm: Normal rate and regular rhythm.     Heart sounds: No murmur heard.    No friction rub. No gallop.  Pulmonary:      Effort: Pulmonary effort is normal. No respiratory distress.     Breath sounds: Normal breath sounds. No stridor. No wheezing, rhonchi or rales.  Musculoskeletal:        General: Normal range of motion.     Cervical back: Normal range of motion.  Neurological:     General: No focal deficit present.     Mental Status: She is alert.  Psychiatric:        Mood and Affect: Mood normal.        Behavior: Behavior normal.      Assessment and Plan:        Endometrial polyp Assessment & Plan: Plan for hysteroscopy, dilation and curettage, polypectomy on 02/15/2024.  Discussed outpatient procedure. Reviewed that  recovery is usually 1-2 days. Risks including infections, bleeding, and damage to surrounding organs reviewed. Recommend NPO prior to midnight and reviewed medication to take on day of surgery. Dicussed use of NSAIDS as needed for pain postoperatively.  Preop checklist: Antibiotics: none DVT ppx: SCDs Postop visit: 1 week Additional clearance: Medical clearance received from PCP on 12/03/2023 (see media)   Postmenopausal bleeding  Hx of tamoxifen  therapy     Vera LULLA Pa, MD

## 2024-02-11 NOTE — Progress Notes (Signed)
 68 y.o. [id number] female with PMB, endometrial polyps, H/O left ER+/PR+ DCIS (January 2023, left lumpectomy, RT, On Tamoxifen  since June 2023), PONV, HTN, renal artery stenosis, HLD, visual aura without migraine here for preop: Diagnostic hysteroscopy, dilation and curettage, polypectomy scheduled for 02/15/2024.  Married.  No LMP recorded (lmp unknown). Patient is postmenopausal.   At 12/02/23 appt, she noted: 1 episode of HVB in March. Had bleeding for ~2wk, no PMB since last visit weeks. No cramping. Had a D&C for missed ab many years ago. Started tamoxifen  June 2023 and is planning to use tamoxifen  for 5 years.  No CP or SOB today. Doing well.  01/28/2022 Pap: NIL  10/28/2023 pathology: A. ENDOMETRIUM, BIOPSY:  - Benign inactive endometrium  - Negative for hyperplasia or malignancy   12/02/2023 TV US /SIS: Uterus: anteverted Sterile saline injected.      2  filling defects noted. 45mmx19mm and 16x79mm. Endometrial thickness: 3.68mm Ovaries were not visualized. Cannula removed. No complication.   Impression:  2 endometrial polyps.  GYN HISTORY: No significant history  OB History  Gravida Para Term Preterm AB Living  6 3 3  3 3   SAB IAB Ectopic Multiple Live Births  3    3    # Outcome Date GA Lbr Len/2nd Weight Sex Type Anes PTL Lv  6 Term     F Vag-Spont   LIV  5 Term     F Vag-Spont   LIV  4 SAB           3 SAB           2 SAB           1 Term     F Vag-Spont   LIV    Past Medical History:  Diagnosis Date   Bradycardia, sinus    Breast cancer (HCC) 06/26/2021   Dry eyes    Elevated LDL cholesterol level 12/07/2019   Estrogen deficiency    Fibromuscular dysplasia (HCC)    Hives    Irregular heartbeat    PONV (postoperative nausea and vomiting)    Renovascular hypertension     Past Surgical History:  Procedure Laterality Date   BREAST BIOPSY Right 07/17/2021   MRI Bx   BREAST LUMPECTOMY WITH RADIOACTIVE SEED LOCALIZATION Left 08/05/2021   Procedure: LEFT  BREAST LUMPECTOMY WITH RADIOACTIVE SEED LOCALIZATION;  Surgeon: Ebbie Cough, MD;  Location: [facility];  Service: General;  Laterality: Left;   BTL     CHILDBIRTH     RADIOACTIVE SEED GUIDED EXCISIONAL BREAST BIOPSY Left 08/05/2021   Procedure: RADIOACTIVE SEED GUIDED EXCISIONAL LEFT BREAST BIOPSY;  Surgeon: Ebbie Cough, MD;  Location:  SURGERY CENTER;  Service: General;  Laterality: Left;    Current Outpatient Medications on File Prior to Visit  Medication Sig Dispense Refill   calcium  citrate (CALCITRATE - DOSED IN MG ELEMENTAL CALCIUM ) 950 (200 Ca) MG tablet Take 200 mg of elemental calcium  by mouth daily.     cetirizine (ZYRTEC) 10 MG tablet Take 10 mg by mouth daily.     cholecalciferol (VITAMIN D) 25 MCG (1000 UNIT) tablet Take by mouth daily.     Multiple Vitamin (MULTIVITAMIN) tablet Take 1 tablet by mouth daily.     olmesartan (BENICAR) 20 MG tablet Take 20 mg by mouth daily.     Omega-3 Fatty Acids (FISH OIL) 1200 MG CAPS Take by mouth.     triamcinolone cream (KENALOG) 0.1 % SMARTSIG:Topical 1-2 Times Daily PRN  No current facility-administered medications on file prior to visit.    Allergies  Allergen Reactions   Atorvastatin  Dermatitis   Other Rash    Chloraprep one step      PE Today's Vitals   02/14/24 0907  BP: (!) 138/90  Pulse: (!) 51  Temp: 98 F (36.7 C)  TempSrc: Oral  SpO2: 98%  Weight: 165 lb 12.8 oz (75.2 kg)    Body mass index is 28.91 kg/m.  Physical Exam Vitals reviewed.  Constitutional:      General: She is not in acute distress.    Appearance: Normal appearance.  HENT:     Head: Normocephalic and atraumatic.     Nose: Nose normal.  Eyes:     Extraocular Movements: Extraocular movements intact.     Conjunctiva/sclera: Conjunctivae normal.  Cardiovascular:     Rate and Rhythm: Normal rate and regular rhythm.     Heart sounds: No murmur heard.    No friction rub. No gallop.  Pulmonary:      Effort: Pulmonary effort is normal. No respiratory distress.     Breath sounds: Normal breath sounds. No stridor. No wheezing, rhonchi or rales.  Musculoskeletal:        General: Normal range of motion.     Cervical back: Normal range of motion.  Neurological:     General: No focal deficit present.     Mental Status: She is alert.  Psychiatric:        Mood and Affect: Mood normal.        Behavior: Behavior normal.      Assessment and Plan:        Endometrial polyp Assessment & Plan: Plan for hysteroscopy, dilation and curettage, polypectomy on 02/15/2024.  Discussed outpatient procedure. Reviewed that  recovery is usually 1-2 days. Risks including infections, bleeding, and damage to surrounding organs reviewed. Recommend NPO prior to midnight and reviewed medication to take on day of surgery. Dicussed use of NSAIDS as needed for pain postoperatively.  Preop checklist: Antibiotics: none DVT ppx: SCDs Postop visit: 1 week Additional clearance: Medical clearance received from PCP on 12/03/2023 (see media)   Postmenopausal bleeding  Hx of tamoxifen  therapy     Vera LULLA Pa, MD

## 2024-02-14 ENCOUNTER — Other Ambulatory Visit (HOSPITAL_COMMUNITY): Payer: Self-pay

## 2024-02-14 ENCOUNTER — Encounter: Payer: Self-pay | Admitting: Obstetrics and Gynecology

## 2024-02-14 ENCOUNTER — Telehealth: Payer: Self-pay | Admitting: Pharmacy Technician

## 2024-02-14 ENCOUNTER — Ambulatory Visit (HOSPITAL_BASED_OUTPATIENT_CLINIC_OR_DEPARTMENT_OTHER): Admitting: Internal Medicine

## 2024-02-14 ENCOUNTER — Ambulatory Visit (INDEPENDENT_AMBULATORY_CARE_PROVIDER_SITE_OTHER): Admitting: Obstetrics and Gynecology

## 2024-02-14 VITALS — BP 138/90 | HR 51 | Temp 98.0°F | Wt 165.8 lb

## 2024-02-14 VITALS — BP 130/72 | HR 61 | Ht 64.0 in | Wt 166.0 lb

## 2024-02-14 DIAGNOSIS — Z9229 Personal history of other drug therapy: Secondary | ICD-10-CM | POA: Diagnosis not present

## 2024-02-14 DIAGNOSIS — Z888 Allergy status to other drugs, medicaments and biological substances status: Secondary | ICD-10-CM

## 2024-02-14 DIAGNOSIS — R931 Abnormal findings on diagnostic imaging of heart and coronary circulation: Secondary | ICD-10-CM | POA: Diagnosis not present

## 2024-02-14 DIAGNOSIS — N950 Postmenopausal bleeding: Secondary | ICD-10-CM | POA: Diagnosis not present

## 2024-02-14 DIAGNOSIS — E785 Hyperlipidemia, unspecified: Secondary | ICD-10-CM | POA: Diagnosis not present

## 2024-02-14 DIAGNOSIS — N840 Polyp of corpus uteri: Secondary | ICD-10-CM

## 2024-02-14 NOTE — Telephone Encounter (Signed)
 Pharmacy Patient Advocate Encounter   Received notification from Physician's Office that prior authorization for Repatha  is required/requested.   Insurance verification completed.   The patient is insured through [employer] .   Per test claim: PA required; PA submitted to above mentioned insurance via CoverMyMeds Key/confirmation #/EOC [id number] Status is pending

## 2024-02-14 NOTE — Assessment & Plan Note (Signed)
 Plan for hysteroscopy, dilation and curettage, polypectomy on 02/15/2024.  Discussed outpatient procedure. Reviewed that  recovery is usually 1-2 days. Risks including infections, bleeding, and damage to surrounding organs reviewed. Recommend NPO prior to midnight and reviewed medication to take on day of surgery. Dicussed use of NSAIDS as needed for pain postoperatively.  Preop checklist: Antibiotics: none DVT ppx: SCDs Postop visit: 1 week Additional clearance: Medical clearance received from PCP on 12/03/2023 (see media)

## 2024-02-14 NOTE — Patient Instructions (Addendum)
 Preoperative instructions: Nothing to eat or drink after midnight, unless instructed differently regarding clear liquids by the anesthesia team at [facility]. Do not take any medications on the day of surgery, except those listed below: olmesartan (BENICAR) 20 MG tablet  Please follow all other instructions as provided by our surgical scheduler at [facility] and the anesthesia team at [facility].  Postoperative instructions: Take over-the-counter Tylenol  as needed.

## 2024-02-14 NOTE — Progress Notes (Signed)
 LIPID CLINIC CONSULT NOTE  Chief Complaint:  Manage dyslipidemia  Primary Care Physician: Whitney Lenis, MD  Primary Cardiologist:  None  HPI:  Whitney Ford is a 68 y.o. female who is being seen today for the evaluation of dyslipidemia at the request of Whitney Lenis, MD. this is a pleasant 68 year old female kindly referred for evaluation management of dyslipidemia.  She has been seen by Dr. Court in the past for fibromuscular dysplasia and renal artery stenosis which came about in a renal transplant evaluation.  She also had a calcium  score in 2021 which was 9, 61st percentile for age and sex matched controls.  She had been on statin therapy for years but developed apparently a severe skin rash which was determined to be related to the statin.  Based on that she was felt to be allergic to the statin and it was not recommended that she try other statins.  Her PCP had considered ezetimibe but then referred her for further evaluation.  Recent lipid testing in May showed total cholesterol 253, HDL 66, triglycerides 101 and LDL 170.  Her target LDL should be less than 70.  PMHx:  Past Medical History:  Diagnosis Date   Bradycardia, sinus    Breast cancer (HCC) 06/26/2021   Dry eyes    Elevated LDL cholesterol level 12/07/2019   Estrogen deficiency    Fibromuscular dysplasia (HCC)    Hives    Irregular heartbeat    PONV (postoperative nausea and vomiting)    Renovascular hypertension     Past Surgical History:  Procedure Laterality Date   BREAST BIOPSY Right 07/17/2021   MRI Bx   BREAST LUMPECTOMY WITH RADIOACTIVE SEED LOCALIZATION Left 08/05/2021   Procedure: LEFT BREAST LUMPECTOMY WITH RADIOACTIVE SEED LOCALIZATION;  Surgeon: Whitney Cough, MD;  Location: [facility];  Service: General;  Laterality: Left;   BTL     CHILDBIRTH     RADIOACTIVE SEED GUIDED EXCISIONAL BREAST BIOPSY Left 08/05/2021   Procedure: RADIOACTIVE SEED GUIDED EXCISIONAL LEFT BREAST  BIOPSY;  Surgeon: Whitney Cough, MD;  Location: [facility];  Service: General;  Laterality: Left;    FAMHx:  Family History  Problem Relation Age of Onset   Hypertension Mother    High Cholesterol Mother    Breast cancer Mother 31   Stroke Father    Multiple myeloma Father 72   Diabetes Sister    Prostate cancer Brother        dx. mid 33s   Stroke Maternal Grandmother    Diabetes Paternal Grandmother    Melanoma Daughter 22    SOCHx:   reports that she has never smoked. She has never been exposed to tobacco smoke. She has never used smokeless tobacco. She reports current alcohol use of about 2.0 standard drinks of alcohol per week. She reports that she does not use drugs.  ALLERGIES:  Allergies  Allergen Reactions   Atorvastatin  Other (See Comments)    Severe skin reaction   Other Rash    Chloraprep one step    ROS: Pertinent items noted in HPI and remainder of comprehensive ROS otherwise negative.  HOME MEDS: Current Outpatient Medications on File Prior to Visit  Medication Sig Dispense Refill   calcium  citrate (CALCITRATE - DOSED IN MG ELEMENTAL CALCIUM ) 950 (200 Ca) MG tablet Take 200 mg of elemental calcium  by mouth daily.     cetirizine (ZYRTEC) 10 MG tablet Take 10 mg by mouth daily.     cholecalciferol (  VITAMIN D) 25 MCG (1000 UNIT) tablet Take by mouth daily.     Multiple Vitamin (MULTIVITAMIN) tablet Take 1 tablet by mouth daily.     olmesartan (BENICAR) 20 MG tablet Take 20 mg by mouth daily.     Omega-3 Fatty Acids (FISH OIL) 1200 MG CAPS Take by mouth.     triamcinolone cream (KENALOG) 0.1 % SMARTSIG:Topical 1-2 Times Daily PRN     No current facility-administered medications on file prior to visit.    LABS/IMAGING: No results found for this or any previous visit (from the past 48 hours). No results found.  LIPID PANEL: No results found for: CHOL, TRIG, HDL, CHOLHDL, VLDL, LDLCALC, LDLDIRECT  No results found for:  LIPOA   WEIGHTS: Wt Readings from Last 3 Encounters:  02/14/24 166 lb (75.3 kg)  02/14/24 165 lb 12.8 oz (75.2 kg)  02/09/24 164 lb 6.4 oz (74.6 kg)    VITALS: BP 130/72   Pulse 61   Ht 5' 4 (1.626 m)   Wt 166 lb (75.3 kg)   LMP  (LMP Unknown)   SpO2 99%   BMI 28.49 kg/m   EXAM: Deferred  EKG: Deferred  ASSESSMENT: Dyslipidemia, goal LDL less than 70 Statin allergic-severe skin reaction with atorvastatin  Fibromuscular dysplasia CAC score of 9, 61st percentile (2021)  PLAN: 1.   This Whitney Ford has a dyslipidemia with target LDL less than 70.  She apparently had a severe skin reaction with atorvastatin  and therefore is considered statin allergic.  Based on this would not recommend additional statins but she does need 50% or greater reduction in her cholesterol.  A PCSK9 inhibitor is a good option for her.  This should be well-tolerated and hopefully get her to target.  Would recommend Repatha  and 40 mg every 2 weeks.  Plan repeat lipids including NMR and LP(a) in about 3 months.  Thanks again for the kind referral.  Whitney [name], MD, [facility], FNLA, FACP  [place]  [facility]  Medical Director of the Advanced Lipid Disorders &  Cardiovascular Risk Reduction Clinic Diplomate of the American Board of Clinical Lipidology Attending Cardiologist  Direct Dial: [phone]  Fax: [phone]  Website:  [email or web address] Whitney Ford Whitney Ford 02/14/2024, 11:52 AM

## 2024-02-14 NOTE — Telephone Encounter (Signed)
 Pharmacy Patient Advocate Encounter  Received notification from [employer] that Prior Authorization for Repatha  has been APPROVED from 07/28/23 to 07/26/24. Ran test claim, Copay is $80.00- 3 months. This test claim was processed through [facility]- copay amounts may vary at other pharmacies due to pharmacy/plan contracts, or as the patient moves through the different stages of their insurance plan.   PA #/Case ID/Reference #: [id number]

## 2024-02-14 NOTE — Patient Instructions (Addendum)
 Medication Instructions:  Dr. Mona recommends Repatha  (PCSK9). This is an injectable cholesterol medication self-administered once every 14 days. This medication will likely need prior approval with your insurance company, which we will work on. If the medication is not approved initially, we may need to do an appeal with your insurance.   Administer medication in area of fatty tissue such as abdomen, outer thigh, back of upper arm - and rotate site with each injection Store medication in refrigerator until ready to administer - allow to sit at room temp for 30 mins - 1 hour prior to injection Dispose of medication in a SHARPS container - your pharmacy should be able to direct you on this and proper disposal   If you need a co-pay card for Repatha : https://www.repatha .com/repatha -cost If you need a co-pay card for Praluent: [email or web address]://[email or web address]  Patient Assistance:    These foundations have funds at various times.   The [employer] Foundation: https://www.panfoundation.org/disease-funds/hypercholesterolemia/ -- can sign up for wait list  The [facility] offers assistance to help pay for medication copays.  They will cover copays for all cholesterol lowering meds, including statins, fibrates, omega-3 fish oils like Vascepa, ezetimibe, Repatha , Praluent, Nexletol, Nexlizet.  The cards are usually good for $2,500 or 12 months, whichever comes first. Our fax # is [phone] (you will need this to apply) Go to healthwellfoundation.org Click on "Apply Now" Answer questions as to whom is applying (patient or representative) Your disease fund will be "hypercholesterolemia - Medicare access" They will ask questions about finances and which medications you are taking for cholesterol When you submit, the approval is usually within minutes.  You will need to print the card information from the site You will need to show this information to your pharmacy, they will bill your  Medicare Part D plan first -then bill Health Well --for the copay.   You can also call them at [phone], although the hold times can be quite long.  *If you need a refill on your cardiac medications before your next appointment, please call your pharmacy*  Lab Work: FASTING- NMR and LPa in 3 months (October 2025) If you have labs (blood work) drawn today and your tests are completely normal, you will receive your results only by: MyChart Message (if you have MyChart) OR A paper copy in the mail If you have any lab test that is abnormal or we need to change your treatment, we will call you to review the results.  Follow-Up: At [facility], you and your health needs are our priority.  As part of our continuing mission to provide you with exceptional heart care, our providers are all part of one team.  This team includes your primary Cardiologist (physician) and Advanced Practice Providers or APPs (Physician Assistants and Nurse Practitioners) who all work together to provide you with the care you need, when you need it.  Your next appointment:   TBD- based on lab results

## 2024-02-15 ENCOUNTER — Encounter (HOSPITAL_COMMUNITY)
Admission: RE | Disposition: A | Discharge status: Home / Self Care | Payer: Self-pay | Source: Home / Self Care | Attending: Obstetrics and Gynecology

## 2024-02-15 ENCOUNTER — Ambulatory Visit (HOSPITAL_COMMUNITY): Admitting: Anesthesiology

## 2024-02-15 ENCOUNTER — Other Ambulatory Visit: Payer: Self-pay

## 2024-02-15 ENCOUNTER — Encounter (HOSPITAL_COMMUNITY): Payer: Self-pay | Admitting: Obstetrics and Gynecology

## 2024-02-15 ENCOUNTER — Ambulatory Visit (HOSPITAL_COMMUNITY)
Admission: RE | Admit: 2024-02-15 | Discharge: 2024-02-15 | Disposition: A | Discharge status: Home / Self Care | Attending: Obstetrics and Gynecology | Admitting: Obstetrics and Gynecology

## 2024-02-15 DIAGNOSIS — Z853 Personal history of malignant neoplasm of breast: Secondary | ICD-10-CM | POA: Insufficient documentation

## 2024-02-15 DIAGNOSIS — Z79899 Other long term (current) drug therapy: Secondary | ICD-10-CM | POA: Insufficient documentation

## 2024-02-15 DIAGNOSIS — N840 Polyp of corpus uteri: Secondary | ICD-10-CM | POA: Diagnosis present

## 2024-02-15 DIAGNOSIS — Z01818 Encounter for other preprocedural examination: Secondary | ICD-10-CM

## 2024-02-15 DIAGNOSIS — I701 Atherosclerosis of renal artery: Secondary | ICD-10-CM | POA: Insufficient documentation

## 2024-02-15 DIAGNOSIS — I739 Peripheral vascular disease, unspecified: Secondary | ICD-10-CM | POA: Insufficient documentation

## 2024-02-15 DIAGNOSIS — I498 Other specified cardiac arrhythmias: Secondary | ICD-10-CM | POA: Diagnosis not present

## 2024-02-15 DIAGNOSIS — I10 Essential (primary) hypertension: Secondary | ICD-10-CM | POA: Diagnosis not present

## 2024-02-15 DIAGNOSIS — E785 Hyperlipidemia, unspecified: Secondary | ICD-10-CM | POA: Insufficient documentation

## 2024-02-15 DIAGNOSIS — N950 Postmenopausal bleeding: Secondary | ICD-10-CM | POA: Diagnosis not present

## 2024-02-15 HISTORY — PX: DILATATION & CURRETTAGE/HYSTEROSCOPY WITH RESECTOCOPE: SHX5572

## 2024-02-15 LAB — CBC
HCT: 39 % (ref 36.0–46.0)
Hemoglobin: 13 g/dL (ref 12.0–15.0)
MCH: 31.8 pg (ref 26.0–34.0)
MCHC: 33.3 g/dL (ref 30.0–36.0)
MCV: 95.4 fL (ref 80.0–100.0)
Platelets: 187 K/uL (ref 150–400)
RBC: 4.09 MIL/uL (ref 3.87–5.11)
RDW: 13 % (ref 11.5–15.5)
WBC: 4.9 K/uL (ref 4.0–10.5)
nRBC: 0 % (ref 0.0–0.2)

## 2024-02-15 LAB — BASIC METABOLIC PANEL WITH GFR
Anion gap: 9 (ref 5–15)
BUN: 10 mg/dL (ref 8–23)
CO2: 23 mmol/L (ref 22–32)
Calcium: 8.9 mg/dL (ref 8.9–10.3)
Chloride: 106 mmol/L (ref 98–111)
Creatinine, Ser: 0.8 mg/dL (ref 0.44–1.00)
GFR, Estimated: >60 mL/min (ref >60)
Glucose, Bld: 79 mg/dL (ref 70–99)
Potassium: 3.7 mmol/L (ref 3.5–5.1)
Sodium: 138 mmol/L (ref 135–145)

## 2024-02-15 SURGERY — DILATATION & CURETTAGE/HYSTEROSCOPY WITH RESECTOCOPE
Anesthesia: General | Site: Vagina

## 2024-02-15 MED ORDER — LIDOCAINE 2% (20 MG/ML) 5 ML SYRINGE
INTRAMUSCULAR | Status: DC | PRN
  Administered 2024-02-15: 60 mg via INTRAVENOUS

## 2024-02-15 MED ORDER — FENTANYL CITRATE (PF) 250 MCG/5ML IJ SOLN
INTRAMUSCULAR | Status: AC
  Filled 2024-02-15: qty 5

## 2024-02-15 MED ORDER — LACTATED RINGERS IV SOLN
INTRAVENOUS | Status: DC | PRN
Start: 2024-02-15 — End: 2024-02-15

## 2024-02-15 MED ORDER — EPHEDRINE SULFATE-NACL 50-0.9 MG/10ML-% IV SOSY
PREFILLED_SYRINGE | INTRAVENOUS | Status: DC | PRN
  Administered 2024-02-15 (×3): 5 mg via INTRAVENOUS

## 2024-02-15 MED ORDER — CHLORHEXIDINE GLUCONATE 0.12 % MT SOLN
15.0000 mL | Freq: Once | OROMUCOSAL | Status: AC
  Filled 2024-02-15: qty 15

## 2024-02-15 MED ORDER — LIDOCAINE HCL (PF) 1 % IJ SOLN
INTRAMUSCULAR | Status: DC | PRN
  Administered 2024-02-15: 10 mL

## 2024-02-15 MED ORDER — SILVER NITRATE-POT NITRATE 75-25 % EX MISC
CUTANEOUS | Status: DC | PRN
Start: 2024-02-15 — End: 2024-02-15
  Administered 2024-02-15: 2

## 2024-02-15 MED ORDER — SODIUM CHLORIDE 0.9 % IR SOLN
Status: DC | PRN
  Administered 2024-02-15: 3000 mL

## 2024-02-15 MED ORDER — DEXAMETHASONE SODIUM PHOSPHATE 10 MG/ML IJ SOLN
INTRAMUSCULAR | Status: DC | PRN
  Administered 2024-02-15: 10 mg via INTRAVENOUS

## 2024-02-15 MED ORDER — ACETAMINOPHEN 500 MG PO TABS
1000.0000 mg | ORAL_TABLET | Freq: Once | ORAL | Status: AC
  Administered 2024-02-15: 1000 mg via ORAL

## 2024-02-15 MED ORDER — REPATHA SURECLICK 140 MG/ML ~~LOC~~ SOAJ: 140.0000 mg | SUBCUTANEOUS | 3 refills | Status: AC

## 2024-02-15 MED ORDER — PROPOFOL 10 MG/ML IV BOLUS
INTRAVENOUS | Status: DC | PRN
  Administered 2024-02-15: 150 mg via INTRAVENOUS

## 2024-02-15 MED ORDER — ORAL CARE MOUTH RINSE
15.0000 mL | Freq: Once | OROMUCOSAL | Status: AC
  Administered 2024-02-15: 15 mL via OROMUCOSAL

## 2024-02-15 MED ORDER — ONDANSETRON HCL 4 MG/2ML IJ SOLN
INTRAMUSCULAR | Status: DC | PRN
  Administered 2024-02-15: 4 mg via INTRAVENOUS

## 2024-02-15 MED ORDER — MIDAZOLAM HCL 2 MG/2ML IJ SOLN
INTRAMUSCULAR | Status: AC
Start: 2024-02-15 — End: 2024-02-15
  Filled 2024-02-15: qty 2

## 2024-02-15 MED ORDER — LIDOCAINE HCL 1 % IJ SOLN
INTRAMUSCULAR | Status: AC
  Filled 2024-02-15: qty 20

## 2024-02-15 MED ORDER — DIPHENHYDRAMINE HCL 50 MG/ML IJ SOLN
INTRAMUSCULAR | Status: DC | PRN
  Administered 2024-02-15: 25 mg via INTRAVENOUS

## 2024-02-15 MED ORDER — ACETAMINOPHEN 500 MG PO TABS
1000.0000 mg | ORAL_TABLET | ORAL | Status: DC
  Filled 2024-02-15: qty 2

## 2024-02-15 MED ORDER — LACTATED RINGERS IV SOLN: INTRAVENOUS | Status: DC

## 2024-02-15 MED ORDER — MIDAZOLAM HCL 2 MG/2ML IJ SOLN
INTRAMUSCULAR | Status: DC | PRN
  Administered 2024-02-15 (×2): 1 mg via INTRAVENOUS

## 2024-02-15 MED ORDER — FENTANYL CITRATE (PF) 250 MCG/5ML IJ SOLN
INTRAMUSCULAR | Status: DC | PRN
  Administered 2024-02-15: 50 ug via INTRAVENOUS

## 2024-02-15 SURGICAL SUPPLY — 18 items
COVER MAYO STAND STRL (DRAPES) ×1 IMPLANT
DEVICE MYOSURE LITE (MISCELLANEOUS) IMPLANT
DEVICE MYOSURE REACH (MISCELLANEOUS) IMPLANT
DILATOR CANAL MILEX (MISCELLANEOUS) IMPLANT
GAUZE 4X4 16PLY ~~LOC~~+RFID DBL (SPONGE) IMPLANT
GLOVE BIO SURGEON STRL SZ7 (GLOVE) ×1 IMPLANT
GLOVE BIOGEL PI IND STRL 7.0 (GLOVE) ×1 IMPLANT
GOWN STRL REUS W/ TWL XL LVL3 (GOWN DISPOSABLE) ×1 IMPLANT
KIT PROCEDURE FLUENT (KITS) ×1 IMPLANT
KIT TURNOVER KIT B (KITS) ×1 IMPLANT
PACK VAGINAL MINOR WOMEN LF (CUSTOM PROCEDURE TRAY) ×1 IMPLANT
PAD OB MATERNITY 11 LF (PERSONAL CARE ITEMS) ×1 IMPLANT
PAD PREP 24X48 CUFFED NSTRL (MISCELLANEOUS) ×1 IMPLANT
SEAL ROD LENS SCOPE MYOSURE (ABLATOR) ×1 IMPLANT
SOL .9 NS 3000ML IRR UROMATIC (IV SOLUTION) ×1 IMPLANT
SOL PREP POV-IOD 4OZ 10% (MISCELLANEOUS) IMPLANT
SYSTEM TISS REMOVAL MYOSURE XL (MISCELLANEOUS) IMPLANT
TOWEL OR 17X24 6PK STRL BLUE (TOWEL DISPOSABLE) ×1 IMPLANT

## 2024-02-15 NOTE — Discharge Instructions (Signed)

## 2024-02-15 NOTE — Op Note (Signed)
 02/15/24  Whitney Ford [id number]  OPERATIVE REPORT  Preop Diagnosis: Pre-Op Diagnosis Codes:     * PMB (postmenopausal bleeding) [N95.0]    * Endometrial polyp [N84.0]   Post operative diagnosis: same  Procedure: Procedure(s): DILATATION & CURETTAGE/HYSTEROSCOPY, POLYPECTOMY WITH MYOSURE   Surgeon: Vera LULLA Pa, MD Assistant: none  Anesthesia: MAC Fluids: please see anesthesia report Fluid deficit: 75cc  Complications: Prepped with hibiclens . Upon my arrival products with rinsed with 2L of normal saline and patient was prepped with betadine. IV benadryl  was administered by anesthesia. Safe zone report was initiated by circulator.  Findings: Normal cervix. Uterine cavity measuring 7cm with both ostia seen. 2 endometrial polyps noted.  Estimated blood loss: Minimal  Specimens:  ID Type Source Tests Collected by Time Destination  1 : endometrial curettings and polyps Tissue PATH Gyn benign resection SURGICAL PATHOLOGY Pa Vera LULLA, MD 02/15/2024 1539     Disposition of specimen: Pathology    Procedure: Patient was taken to the OR where she was placed in dorsal lithotomy in stirrups. She voided prior to transport. SCDs were in place.  The patient was prepped and draped in the usual sterile fashion. See above for noted complication. An adequate timeout was obtained and everyone agreed. A bivalve speculum was placed inside the vagina and the cervix visualized. The cervix was grasped anteriorly with a single-tooth tenaculum. Paracervical block was performed with 1% lidocaine .  The uterus sounded to 7 cm. Sequential cervical dilation was done to 27fr, and the myosure hysteroscope was introduced into the uterine cavity. The cervix and endometrial lining appeared normal both ostia were seen. Findings as noted. Polyp were removed with myosure.  The myosure was also used to sample the entire cavity.  A sharp curettage was then performed to ensure complete sampling of the cavity and was  sent to pathology. All instrument, sponge and lap counts were correct x2. The patient was awakened taken to recovery room in stable condition. No rash noted at end of the procedure. Error disclosed to patient's husband.  Vera LULLA Pa, MD 02/15/24  5:27 PM

## 2024-02-15 NOTE — Anesthesia Procedure Notes (Signed)
 Procedure Name: LMA Insertion Date/Time: 02/15/2024 4:46 PM  Performed by: Arvell Edsel HERO, CRNAPre-anesthesia Checklist: Patient identified, Emergency Drugs available, Suction available, Patient being monitored and Timeout performed Patient Re-evaluated:Patient Re-evaluated prior to induction Oxygen Delivery Method: Circle system utilized Preoxygenation: Pre-oxygenation with 100% oxygen Induction Type: IV induction LMA: LMA inserted Laryngoscope Size: 3 Number of attempts: 1 Placement Confirmation: positive ETCO2, breath sounds checked- equal and bilateral and CO2 detector Tube secured with: Tape

## 2024-02-15 NOTE — Progress Notes (Signed)
 Patient reports being given chlorhexadine based wipe in preop, ~2hr ago. No rash or SOB. Her documented reaction to chlorhexadine is rash. Will continue to monitor. Plan for benadryl  if needed. Reviewed with preop nursing staff.

## 2024-02-15 NOTE — Discharge Instr - Supplementary Instructions (Signed)
 May take Tylenol  after 9pm if needed for discomfort.

## 2024-02-15 NOTE — Progress Notes (Signed)
 Surgeon came to desk per pt request to ask what type of wipes she was given prior to surgery. She does have a CHG Allergy listed and asked if these wipes contained any. It was determined the pt was given CHG wipes. Pt states last time she broke out within 24 hours and had a rash that lasted for months. Pt is asymptomatic at this time. The wipes were taken in by the NT upon her arrival. This pt's chart workup and instructions were completed by [facility]. There was no note written on the Pts physical chart or the door card to make NT aware of allergy prior to nurse coming in and reviewing EPIC chart. Pt was asked to call out if she started having any symptoms and told to come back to the hospital or call 911 if she were to have any chest pain or difficulty breathing when she gets home. Pt states she does have an epi-pen for allergic reactions if needed. SS Nurse manager made aware.

## 2024-02-15 NOTE — Transfer of Care (Signed)
 Immediate Anesthesia Transfer of Care Note  Patient: Whitney Ford  Procedure(s) Performed: DILATATION & CURETTAGE/HYSTEROSCOPY WITH MYOSURE (Vagina )  Patient Location: PACU  Anesthesia Type:General  Level of Consciousness: awake, alert , oriented, and patient cooperative  Airway & Oxygen Therapy: Patient Spontanous Breathing and Patient connected to face mask oxygen  Post-op Assessment: Report given to RN, Post -op Vital signs reviewed and stable, Patient moving all extremities, and Patient moving all extremities X 4  Post vital signs: Reviewed and stable  Last Vitals:  Vitals Value Taken Time  BP 123/92 02/15/24 17:24  Temp    Pulse 67 02/15/24 17:28  Resp 16 02/15/24 17:28  SpO2 100 % 02/15/24 17:28  Vitals shown include unfiled device data.  Last Pain:  Vitals:   02/15/24 1452  PainSc: 0-No pain         Complications: No notable events documented.

## 2024-02-15 NOTE — Interval H&P Note (Signed)
 Date of Initial H&P: 02/14/2024  History reviewed, patient examined, no change in status, stable for surgery.

## 2024-02-15 NOTE — Addendum Note (Signed)
 Addended by: ANDREZ PRAIRIE on: 02/15/2024 09:09 AM   Modules accepted: Orders

## 2024-02-15 NOTE — Anesthesia Preprocedure Evaluation (Addendum)
 Anesthesia Evaluation  Patient identified by MRN, date of birth, ID band Patient awake    Reviewed: Allergy & Precautions, NPO status , Patient's Chart, lab work & pertinent test results  History of Anesthesia Complications (+) PONV and history of anesthetic complications  Airway Mallampati: II  TM Distance: >3 FB Neck ROM: Full    Dental  (+) Teeth Intact, Dental Advisory Given   Pulmonary  Snores at night, no sleep study    Pulmonary exam normal breath sounds clear to auscultation       Cardiovascular hypertension (130/78 preop), Pt. on medications + Peripheral Vascular Disease  Normal cardiovascular exam Rhythm:Regular Rate:Normal     Neuro/Psych negative neurological ROS  negative psych ROS   GI/Hepatic negative GI ROS, Neg liver ROS,,,  Endo/Other  negative endocrine ROS    Renal/GU negative Renal ROS  negative genitourinary   Musculoskeletal negative musculoskeletal ROS (+)    Abdominal   Peds  Hematology negative hematology ROS (+)   Anesthesia Other Findings   Reproductive/Obstetrics negative OB ROS                              Anesthesia Physical Anesthesia Plan  ASA: 2  Anesthesia Plan: General   Post-op Pain Management: Tylenol  PO (pre-op)* and Toradol IV (intra-op)*   Induction: Intravenous  PONV Risk Score and Plan: 4 or greater and Ondansetron , Dexamethasone , Midazolam  and Treatment may vary due to age or medical condition  Airway Management Planned: LMA  Additional Equipment: None  Intra-op Plan:   Post-operative Plan: Extubation in OR  Informed Consent: I have reviewed the patients History and Physical, chart, labs and discussed the procedure including the risks, benefits and alternatives for the proposed anesthesia with the patient or authorized representative who has indicated his/her understanding and acceptance.     Dental advisory given  Plan  Discussed with: CRNA  Anesthesia Plan Comments:          Anesthesia Quick Evaluation

## 2024-02-16 ENCOUNTER — Encounter (HOSPITAL_COMMUNITY): Payer: Self-pay | Admitting: Obstetrics and Gynecology

## 2024-02-16 NOTE — Anesthesia Postprocedure Evaluation (Signed)
 Anesthesia Post Note  Patient: Whitney Ford  Procedure(s) Performed: DILATATION & CURETTAGE/HYSTEROSCOPY WITH MYOSURE (Vagina )     Patient location during evaluation: PACU Anesthesia Type: General Level of consciousness: awake and alert, oriented and patient cooperative Pain management: pain level controlled Vital Signs Assessment: post-procedure vital signs reviewed and stable Respiratory status: spontaneous breathing, nonlabored ventilation and respiratory function stable Cardiovascular status: blood pressure returned to baseline and stable Postop Assessment: no apparent nausea or vomiting Anesthetic complications: no   No notable events documented.  Last Vitals:  Vitals:   02/15/24 1759 02/15/24 1800  BP:  128/68  Pulse: (!) 55 (!) 58  Resp: 13 13  Temp: 36.5 C   SpO2: 97% 96%    Last Pain:  Vitals:   02/15/24 1452  PainSc: 0-No pain                 Whitney Ford

## 2024-02-17 LAB — SURGICAL PATHOLOGY

## 2024-02-18 ENCOUNTER — Ambulatory Visit: Payer: Self-pay | Admitting: Obstetrics and Gynecology

## 2024-02-25 ENCOUNTER — Ambulatory Visit: Admitting: Radiology

## 2024-02-25 ENCOUNTER — Encounter: Payer: Self-pay | Admitting: Radiology

## 2024-02-25 VITALS — BP 122/74 | HR 80 | Ht 63.5 in | Wt 164.0 lb

## 2024-02-25 DIAGNOSIS — N952 Postmenopausal atrophic vaginitis: Secondary | ICD-10-CM

## 2024-02-25 DIAGNOSIS — Z9289 Personal history of other medical treatment: Secondary | ICD-10-CM | POA: Diagnosis not present

## 2024-02-25 DIAGNOSIS — D0512 Intraductal carcinoma in situ of left breast: Secondary | ICD-10-CM | POA: Diagnosis not present

## 2024-02-25 DIAGNOSIS — Z01411 Encounter for gynecological examination (general) (routine) with abnormal findings: Secondary | ICD-10-CM

## 2024-02-25 DIAGNOSIS — Z01419 Encounter for gynecological examination (general) (routine) without abnormal findings: Secondary | ICD-10-CM

## 2024-02-25 DIAGNOSIS — D219 Benign neoplasm of connective and other soft tissue, unspecified: Secondary | ICD-10-CM | POA: Diagnosis not present

## 2024-02-25 NOTE — Progress Notes (Signed)
   Whitney Ford 08-26-1955 [id number]   History: Postmenopausal 68 y.o. presents for annual exam. S/p DCIS left breast with lumpectomy. Oncology stopped tamoxifen  in June, doing well. No other gyn concerns. S/p d&c last month with Dr Dallie, pathology showed benign endometrial polyps.   Gynecologic History Postmenopausal Last Pap: 2023. Results were: normal Last mammogram: 11/24. Results were: normal Last colonoscopy: 2016 [id number] (managed by PCP)   Obstetric History OB History  Gravida Para Term Preterm AB Living  6 3 3  3 3   SAB IAB Ectopic Multiple Live Births  3    3    # Outcome Date GA Lbr Len/2nd Weight Sex Type Anes PTL Lv  6 Term     F Vag-Spont   LIV  5 Term     F Vag-Spont   LIV  4 SAB           3 SAB           2 SAB           1 Term     F Vag-Spont   LIV     The following portions of the patient's history were reviewed and updated as appropriate: allergies, current medications, past family history, past medical history, past social history, past surgical history, and problem list.  Review of Systems Pertinent items noted in HPI and remainder of comprehensive ROS otherwise negative.  Past medical history, past surgical history, family history and social history were all reviewed and documented in the EPIC chart.  Exam:  Vitals:   02/25/24 1547  BP: 122/74  Pulse: 80  SpO2: 95%  Weight: 164 lb (74.4 kg)  Height: 5' 3.5 (1.613 m)    Body mass index is 28.6 kg/m.  General appearance:  Normal Thyroid :  Symmetrical, normal in size, without palpable masses or nodularity. Respiratory  Auscultation:  Clear without wheezing or rhonchi Cardiovascular  Auscultation:  Regular rate, without rubs, murmurs or gallops  Edema/varicosities:  Not grossly evident Abdominal  Soft,nontender, without masses, guarding or rebound.  Liver/spleen:  No organomegaly noted  Hernia:  None appreciated  Skin  Inspection:  Grossly normal Breasts: Examined lying and  sitting.   Right: Without masses, retractions, nipple discharge or axillary adenopathy.   Left: S/p lumpectomy Without masses, retractions, nipple discharge or axillary adenopathy. Genitourinary   Inguinal/mons:  Normal without inguinal adenopathy  External genitalia:  Normal appearing vulva with no masses, tenderness, or lesions  BUS/Urethra/Skene's glands:  Normal  Vagina:  Normal appearing with normal color and discharge, no lesions. Atrophy: mild   Cervix:  Normal appearing without discharge or lesions  Uterus:  Normal in size, shape and contour.  Midline and mobile, nontender  Adnexa/parametria:     Rt: Normal in size, without masses or tenderness.   Lt: Normal in size, without masses or tenderness.  Anus and perineum: Normal    [name], CMA present for exam  Assessment/Plan:   1. Well woman exam with routine gynecological exam Pap due 2028  2. Ductal carcinoma in situ (DCIS) of left breast S/p lumpectomy  Off tamoxifen  since June, still followed by oncology    Return in 1 year for annual or sooner prn.  [name] B WHNP-BC, 3:51 PM 02/25/2024

## 2024-02-29 ENCOUNTER — Ambulatory Visit (INDEPENDENT_AMBULATORY_CARE_PROVIDER_SITE_OTHER): Admitting: Obstetrics and Gynecology

## 2024-02-29 ENCOUNTER — Encounter: Payer: Self-pay | Admitting: Obstetrics and Gynecology

## 2024-02-29 VITALS — BP 122/64 | HR 67 | Temp 98.1°F | Wt 164.0 lb

## 2024-02-29 DIAGNOSIS — Z09 Encounter for follow-up examination after completed treatment for conditions other than malignant neoplasm: Secondary | ICD-10-CM

## 2024-02-29 NOTE — Progress Notes (Signed)
 68 y.o. [id number] female with PMB, endometrial polyps, H/O left ER+/PR+ DCIS (January 2023, left lumpectomy, RT, On Tamoxifen  since June 2023), PONV, HTN, renal artery stenosis, HLD, visual aura without migraine here for postop: Diagnostic hysteroscopy, dilation and curettage, polypectomy scheduled for 02/15/2024.  Married.  No LMP recorded (lmp unknown). Patient is postmenopausal.   02/15/24 pathology: A. ENDOMETRIUM, CURETTAGE AND POLYPS:  - Benign endometrial polyp   Procedure c/b hiblicens use for prep. No reaction noted. Treated with IV benadryl . Received IV steroids per anesthesia MAC protocol. She reports having a headache for a week and a half, took tylenol  x62for it, has went away as of 02/27/24. Denies N/V, abdominal pain, VB, dysuria. Normal BM and voids.   GYN HISTORY: No significant history  OB History  Gravida Para Term Preterm AB Living  6 3 3  3 3   SAB IAB Ectopic Multiple Live Births  3    3    # Outcome Date GA Lbr Len/2nd Weight Sex Type Anes PTL Lv  6 Term     F Vag-Spont   LIV  5 Term     F Vag-Spont   LIV  4 SAB           3 SAB           2 SAB           1 Term     F Vag-Spont   LIV    Past Medical History:  Diagnosis Date   Bradycardia, sinus    Breast cancer (HCC) 06/26/2021   Dry eyes    Elevated LDL cholesterol level 12/07/2019   Estrogen deficiency    Fibromuscular dysplasia (HCC)    Hives    Irregular heartbeat    PONV (postoperative nausea and vomiting)    Renovascular hypertension     Past Surgical History:  Procedure Laterality Date   BREAST BIOPSY Right 07/17/2021   MRI Bx   BREAST LUMPECTOMY WITH RADIOACTIVE SEED LOCALIZATION Left 08/05/2021   Procedure: LEFT BREAST LUMPECTOMY WITH RADIOACTIVE SEED LOCALIZATION;  Surgeon: Ebbie Cough, MD;  Location: [facility];  Service: General;  Laterality: Left;   BTL     CHILDBIRTH     DILATATION & CURRETTAGE/HYSTEROSCOPY WITH RESECTOCOPE N/A 02/15/2024   Procedure: DILATATION &  CURETTAGE/HYSTEROSCOPY WITH MYOSURE;  Surgeon: Dallie Vera GAILS, MD;  Location: [facility];  Service: Gynecology;  Laterality: N/A;  Myosure   RADIOACTIVE SEED GUIDED EXCISIONAL BREAST BIOPSY Left 08/05/2021   Procedure: RADIOACTIVE SEED GUIDED EXCISIONAL LEFT BREAST BIOPSY;  Surgeon: Ebbie Cough, MD;  Location: [facility];  Service: General;  Laterality: Left;    Current Outpatient Medications on File Prior to Visit  Medication Sig Dispense Refill   calcium  citrate (CALCITRATE - DOSED IN MG ELEMENTAL CALCIUM ) 950 (200 Ca) MG tablet Take 200 mg of elemental calcium  by mouth daily.     cetirizine (ZYRTEC) 10 MG tablet Take 10 mg by mouth daily.     cholecalciferol (VITAMIN D) 25 MCG (1000 UNIT) tablet Take by mouth daily.     Evolocumab  (REPATHA  SURECLICK) 140 MG/ML SOAJ Inject 140 mg into the skin every 14 (fourteen) days. 6 mL 3   Multiple Vitamin (MULTIVITAMIN) tablet Take 1 tablet by mouth daily.     olmesartan (BENICAR) 20 MG tablet Take 20 mg by mouth daily.     Omega-3 Fatty Acids (FISH OIL) 1200 MG CAPS Take by mouth.     triamcinolone cream (KENALOG) 0.1 % SMARTSIG:Topical 1-2  Times Daily PRN     No current facility-administered medications on file prior to visit.    Allergies  Allergen Reactions   Atorvastatin  Other (See Comments)    Severe skin reaction   Other Rash    Chloraprep one step      PE Today's Vitals   02/29/24 1032  BP: 122/64  Pulse: 67  Temp: 98.1 F (36.7 C)  TempSrc: Oral  SpO2: 98%  Weight: 164 lb (74.4 kg)     Body mass index is 28.6 kg/m.  Physical Exam Vitals reviewed.  Constitutional:      General: She is not in acute distress.    Appearance: Normal appearance.  HENT:     Head: Normocephalic and atraumatic.     Nose: Nose normal.  Eyes:     Extraocular Movements: Extraocular movements intact.     Conjunctiva/sclera: Conjunctivae normal.  Pulmonary:     Effort: Pulmonary effort is normal.  Abdominal:     General:  There is no distension.     Palpations: Abdomen is soft.     Tenderness: There is no abdominal tenderness.  Musculoskeletal:        General: Normal range of motion.     Cervical back: Normal range of motion.  Neurological:     General: No focal deficit present.     Mental Status: She is alert.  Psychiatric:        Mood and Affect: Mood normal.        Behavior: Behavior normal.      Assessment and Plan:        Postop check  Normal postop exam.  Pathology reviewed with patient. Cleared to return to work/activites. All questions answered. Tamoxifene stopped by Dr. Odean. Not planing to restart.    Vera LULLA Pa, MD

## 2024-03-23 ENCOUNTER — Institutional Professional Consult (permissible substitution) (HOSPITAL_BASED_OUTPATIENT_CLINIC_OR_DEPARTMENT_OTHER): Admitting: Internal Medicine

## 2024-05-08 ENCOUNTER — Other Ambulatory Visit: Payer: Self-pay

## 2024-05-08 DIAGNOSIS — E785 Hyperlipidemia, unspecified: Secondary | ICD-10-CM

## 2024-05-09 LAB — NMR, LIPOPROFILE
Cholesterol, Total: 167 mg/dL (ref 100–199)
HDL Particle Number: 40.1 umol/L (ref >=30.5)
HDL-C: 58 mg/dL (ref >39)
LDL Particle Number: 835 nmol/L (ref <1000)
LDL Size: 20.1 nm — ABNORMAL LOW (ref >20.5)
LDL-C (NIH Calc): 67 mg/dL (ref 0–99)
LP-IR Score: 73 — ABNORMAL HIGH (ref <=45)
Small LDL Particle Number: 409 nmol/L (ref <=527)
Triglycerides: 267 mg/dL — ABNORMAL HIGH (ref 0–149)

## 2024-05-09 LAB — LIPOPROTEIN A (LPA): Lipoprotein (a): 72.8 nmol/L (ref <75.0)

## 2024-05-10 ENCOUNTER — Ambulatory Visit: Payer: Self-pay | Admitting: Internal Medicine

## 2024-06-16 DIAGNOSIS — Z1231 Encounter for screening mammogram for malignant neoplasm of breast: Secondary | ICD-10-CM | POA: Diagnosis not present

## 2024-06-16 LAB — HM MAMMOGRAPHY

## 2024-06-19 ENCOUNTER — Ambulatory Visit: Payer: Self-pay | Admitting: Obstetrics and Gynecology

## 2025-01-18 ENCOUNTER — Encounter: Admitting: Adult Health
